# Patient Record
Sex: Male | Born: 1965
Health system: Southern US, Community
[De-identification: ages and names within clinical notes are randomized; demographics above are authoritative.]

## PROBLEM LIST (undated history)

## (undated) DIAGNOSIS — E10319 Type 1 diabetes mellitus with unspecified diabetic retinopathy without macular edema: Secondary | ICD-10-CM

## (undated) DIAGNOSIS — I739 Peripheral vascular disease, unspecified: Secondary | ICD-10-CM

## (undated) DIAGNOSIS — E0590 Thyrotoxicosis, unspecified without thyrotoxic crisis or storm: Secondary | ICD-10-CM

## (undated) DIAGNOSIS — E785 Hyperlipidemia, unspecified: Secondary | ICD-10-CM

## (undated) DIAGNOSIS — E109 Type 1 diabetes mellitus without complications: Secondary | ICD-10-CM

## (undated) DIAGNOSIS — I10 Essential (primary) hypertension: Secondary | ICD-10-CM

## (undated) DIAGNOSIS — N182 Chronic kidney disease, stage 2 (mild): Secondary | ICD-10-CM

## (undated) DIAGNOSIS — E1021 Type 1 diabetes mellitus with diabetic nephropathy: Secondary | ICD-10-CM

## (undated) HISTORY — DX: Hyperlipidemia, unspecified: E78.5

## (undated) HISTORY — DX: Chronic kidney disease, stage 2 (mild): N18.2

## (undated) HISTORY — DX: Type 1 diabetes mellitus with diabetic nephropathy: E10.21

## (undated) HISTORY — DX: Thyrotoxicosis, unspecified without thyrotoxic crisis or storm: E05.90

## (undated) HISTORY — DX: Type 1 diabetes mellitus without complications: E10.9

## (undated) HISTORY — DX: Type 1 diabetes mellitus with unspecified diabetic retinopathy without macular edema: E10.319

## (undated) HISTORY — DX: Essential (primary) hypertension: I10

## (undated) HISTORY — DX: Peripheral vascular disease, unspecified: I73.9

---

## 2010-01-17 ENCOUNTER — Encounter
Admission: RE | Admit: 2010-01-17 | Discharge: 2010-01-17 | Payer: Self-pay | Source: Home / Self Care | Attending: Neurosurgery | Admitting: Neurosurgery

## 2011-01-14 DIAGNOSIS — E109 Type 1 diabetes mellitus without complications: Secondary | ICD-10-CM | POA: Diagnosis not present

## 2011-01-14 DIAGNOSIS — I10 Essential (primary) hypertension: Secondary | ICD-10-CM | POA: Diagnosis not present

## 2011-01-14 DIAGNOSIS — E039 Hypothyroidism, unspecified: Secondary | ICD-10-CM | POA: Diagnosis not present

## 2011-01-14 DIAGNOSIS — E785 Hyperlipidemia, unspecified: Secondary | ICD-10-CM | POA: Diagnosis not present

## 2011-02-19 DIAGNOSIS — E1169 Type 2 diabetes mellitus with other specified complication: Secondary | ICD-10-CM | POA: Diagnosis not present

## 2011-02-19 DIAGNOSIS — E785 Hyperlipidemia, unspecified: Secondary | ICD-10-CM | POA: Diagnosis not present

## 2011-02-19 DIAGNOSIS — E1142 Type 2 diabetes mellitus with diabetic polyneuropathy: Secondary | ICD-10-CM | POA: Diagnosis not present

## 2011-02-19 DIAGNOSIS — E1049 Type 1 diabetes mellitus with other diabetic neurological complication: Secondary | ICD-10-CM | POA: Diagnosis not present

## 2011-02-25 DIAGNOSIS — E1169 Type 2 diabetes mellitus with other specified complication: Secondary | ICD-10-CM | POA: Diagnosis not present

## 2011-02-25 DIAGNOSIS — E1065 Type 1 diabetes mellitus with hyperglycemia: Secondary | ICD-10-CM | POA: Diagnosis not present

## 2011-02-25 DIAGNOSIS — IMO0002 Reserved for concepts with insufficient information to code with codable children: Secondary | ICD-10-CM | POA: Diagnosis not present

## 2011-02-26 DIAGNOSIS — E1039 Type 1 diabetes mellitus with other diabetic ophthalmic complication: Secondary | ICD-10-CM | POA: Diagnosis not present

## 2011-02-26 DIAGNOSIS — E11359 Type 2 diabetes mellitus with proliferative diabetic retinopathy without macular edema: Secondary | ICD-10-CM | POA: Diagnosis not present

## 2011-02-26 DIAGNOSIS — H16219 Exposure keratoconjunctivitis, unspecified eye: Secondary | ICD-10-CM | POA: Diagnosis not present

## 2011-03-06 DIAGNOSIS — IMO0002 Reserved for concepts with insufficient information to code with codable children: Secondary | ICD-10-CM | POA: Diagnosis not present

## 2011-03-06 DIAGNOSIS — E1065 Type 1 diabetes mellitus with hyperglycemia: Secondary | ICD-10-CM | POA: Diagnosis not present

## 2011-04-09 DIAGNOSIS — E109 Type 1 diabetes mellitus without complications: Secondary | ICD-10-CM | POA: Diagnosis not present

## 2011-04-09 DIAGNOSIS — Z713 Dietary counseling and surveillance: Secondary | ICD-10-CM | POA: Diagnosis not present

## 2011-04-09 DIAGNOSIS — Z794 Long term (current) use of insulin: Secondary | ICD-10-CM | POA: Diagnosis not present

## 2011-04-09 DIAGNOSIS — Z9641 Presence of insulin pump (external) (internal): Secondary | ICD-10-CM | POA: Diagnosis not present

## 2011-04-15 DIAGNOSIS — E109 Type 1 diabetes mellitus without complications: Secondary | ICD-10-CM | POA: Diagnosis not present

## 2011-04-15 DIAGNOSIS — I10 Essential (primary) hypertension: Secondary | ICD-10-CM | POA: Diagnosis not present

## 2011-04-15 DIAGNOSIS — E785 Hyperlipidemia, unspecified: Secondary | ICD-10-CM | POA: Diagnosis not present

## 2011-05-09 DIAGNOSIS — I10 Essential (primary) hypertension: Secondary | ICD-10-CM | POA: Diagnosis not present

## 2011-05-20 DIAGNOSIS — E1142 Type 2 diabetes mellitus with diabetic polyneuropathy: Secondary | ICD-10-CM | POA: Diagnosis not present

## 2011-05-20 DIAGNOSIS — E785 Hyperlipidemia, unspecified: Secondary | ICD-10-CM | POA: Diagnosis not present

## 2011-05-20 DIAGNOSIS — I10 Essential (primary) hypertension: Secondary | ICD-10-CM | POA: Diagnosis not present

## 2011-05-20 DIAGNOSIS — E1049 Type 1 diabetes mellitus with other diabetic neurological complication: Secondary | ICD-10-CM | POA: Diagnosis not present

## 2011-05-22 DIAGNOSIS — H538 Other visual disturbances: Secondary | ICD-10-CM | POA: Diagnosis not present
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
## 2011-07-15 DIAGNOSIS — E109 Type 1 diabetes mellitus without complications: Secondary | ICD-10-CM | POA: Diagnosis not present

## 2011-07-15 DIAGNOSIS — I10 Essential (primary) hypertension: Secondary | ICD-10-CM | POA: Diagnosis not present

## 2011-07-15 DIAGNOSIS — E785 Hyperlipidemia, unspecified: Secondary | ICD-10-CM | POA: Diagnosis not present

## 2011-07-24 DIAGNOSIS — Z794 Long term (current) use of insulin: Secondary | ICD-10-CM | POA: Diagnosis not present

## 2011-07-24 DIAGNOSIS — Z713 Dietary counseling and surveillance: Secondary | ICD-10-CM | POA: Diagnosis not present

## 2011-07-24 DIAGNOSIS — E109 Type 1 diabetes mellitus without complications: Secondary | ICD-10-CM | POA: Diagnosis not present

## 2011-07-24 DIAGNOSIS — Z9641 Presence of insulin pump (external) (internal): Secondary | ICD-10-CM | POA: Diagnosis not present

## 2011-08-21 DIAGNOSIS — E1065 Type 1 diabetes mellitus with hyperglycemia: Secondary | ICD-10-CM | POA: Diagnosis not present

## 2011-08-21 DIAGNOSIS — E1049 Type 1 diabetes mellitus with other diabetic neurological complication: Secondary | ICD-10-CM | POA: Diagnosis not present

## 2011-08-21 DIAGNOSIS — E785 Hyperlipidemia, unspecified: Secondary | ICD-10-CM | POA: Diagnosis not present

## 2011-08-21 DIAGNOSIS — E890 Postprocedural hypothyroidism: Secondary | ICD-10-CM | POA: Diagnosis not present

## 2011-08-21 DIAGNOSIS — E1142 Type 2 diabetes mellitus with diabetic polyneuropathy: Secondary | ICD-10-CM | POA: Diagnosis not present

## 2011-08-21 DIAGNOSIS — IMO0002 Reserved for concepts with insufficient information to code with codable children: Secondary | ICD-10-CM | POA: Diagnosis not present

## 2011-08-27 DIAGNOSIS — H4011X0 Primary open-angle glaucoma, stage unspecified: Secondary | ICD-10-CM | POA: Diagnosis not present

## 2011-08-27 DIAGNOSIS — E11359 Type 2 diabetes mellitus with proliferative diabetic retinopathy without macular edema: Secondary | ICD-10-CM | POA: Diagnosis not present

## 2011-08-27 DIAGNOSIS — E1039 Type 1 diabetes mellitus with other diabetic ophthalmic complication: Secondary | ICD-10-CM | POA: Diagnosis not present

## 2011-10-16 DIAGNOSIS — E756 Lipid storage disorder, unspecified: Secondary | ICD-10-CM | POA: Diagnosis not present

## 2011-10-16 DIAGNOSIS — E109 Type 1 diabetes mellitus without complications: Secondary | ICD-10-CM | POA: Diagnosis not present

## 2011-10-16 DIAGNOSIS — E785 Hyperlipidemia, unspecified: Secondary | ICD-10-CM | POA: Diagnosis not present

## 2011-10-30 DIAGNOSIS — E1065 Type 1 diabetes mellitus with hyperglycemia: Secondary | ICD-10-CM | POA: Diagnosis not present

## 2011-10-30 DIAGNOSIS — IMO0002 Reserved for concepts with insufficient information to code with codable children: Secondary | ICD-10-CM | POA: Diagnosis not present

## 2011-11-22 DIAGNOSIS — E1142 Type 2 diabetes mellitus with diabetic polyneuropathy: Secondary | ICD-10-CM | POA: Diagnosis not present

## 2011-11-22 DIAGNOSIS — E785 Hyperlipidemia, unspecified: Secondary | ICD-10-CM | POA: Diagnosis not present

## 2011-11-22 DIAGNOSIS — I10 Essential (primary) hypertension: Secondary | ICD-10-CM | POA: Diagnosis not present

## 2011-11-22 DIAGNOSIS — E890 Postprocedural hypothyroidism: Secondary | ICD-10-CM | POA: Diagnosis not present

## 2011-11-22 DIAGNOSIS — J019 Acute sinusitis, unspecified: Secondary | ICD-10-CM | POA: Diagnosis not present

## 2011-11-22 DIAGNOSIS — E1149 Type 2 diabetes mellitus with other diabetic neurological complication: Secondary | ICD-10-CM | POA: Diagnosis not present

## 2012-01-16 DIAGNOSIS — E785 Hyperlipidemia, unspecified: Secondary | ICD-10-CM | POA: Diagnosis not present

## 2012-01-16 DIAGNOSIS — IMO0002 Reserved for concepts with insufficient information to code with codable children: Secondary | ICD-10-CM | POA: Diagnosis not present

## 2012-01-16 DIAGNOSIS — E109 Type 1 diabetes mellitus without complications: Secondary | ICD-10-CM | POA: Diagnosis not present

## 2012-01-29 DIAGNOSIS — E1065 Type 1 diabetes mellitus with hyperglycemia: Secondary | ICD-10-CM | POA: Diagnosis not present

## 2012-01-29 DIAGNOSIS — IMO0002 Reserved for concepts with insufficient information to code with codable children: Secondary | ICD-10-CM | POA: Diagnosis not present

## 2012-01-30 DIAGNOSIS — M25539 Pain in unspecified wrist: Secondary | ICD-10-CM | POA: Diagnosis not present

## 2012-02-04 DIAGNOSIS — M25539 Pain in unspecified wrist: Secondary | ICD-10-CM | POA: Diagnosis not present

## 2012-02-04 DIAGNOSIS — M19039 Primary osteoarthritis, unspecified wrist: Secondary | ICD-10-CM | POA: Diagnosis not present

## 2012-02-11 DIAGNOSIS — M25539 Pain in unspecified wrist: Secondary | ICD-10-CM | POA: Diagnosis not present

## 2012-02-24 DIAGNOSIS — E1149 Type 2 diabetes mellitus with other diabetic neurological complication: Secondary | ICD-10-CM | POA: Diagnosis not present

## 2012-02-24 DIAGNOSIS — E890 Postprocedural hypothyroidism: Secondary | ICD-10-CM | POA: Diagnosis not present

## 2012-02-24 DIAGNOSIS — E1142 Type 2 diabetes mellitus with diabetic polyneuropathy: Secondary | ICD-10-CM | POA: Diagnosis not present

## 2012-02-24 DIAGNOSIS — E1065 Type 1 diabetes mellitus with hyperglycemia: Secondary | ICD-10-CM | POA: Diagnosis not present

## 2012-02-24 DIAGNOSIS — IMO0002 Reserved for concepts with insufficient information to code with codable children: Secondary | ICD-10-CM | POA: Diagnosis not present

## 2012-03-03 DIAGNOSIS — H4011X0 Primary open-angle glaucoma, stage unspecified: Secondary | ICD-10-CM | POA: Diagnosis not present

## 2012-03-03 DIAGNOSIS — E11359 Type 2 diabetes mellitus with proliferative diabetic retinopathy without macular edema: Secondary | ICD-10-CM | POA: Diagnosis not present

## 2012-03-03 DIAGNOSIS — E1039 Type 1 diabetes mellitus with other diabetic ophthalmic complication: Secondary | ICD-10-CM | POA: Diagnosis not present

## 2012-04-16 DIAGNOSIS — E109 Type 1 diabetes mellitus without complications: Secondary | ICD-10-CM | POA: Diagnosis not present

## 2012-04-29 DIAGNOSIS — IMO0002 Reserved for concepts with insufficient information to code with codable children: Secondary | ICD-10-CM | POA: Diagnosis not present

## 2012-04-29 DIAGNOSIS — E1065 Type 1 diabetes mellitus with hyperglycemia: Secondary | ICD-10-CM | POA: Diagnosis not present

## 2012-05-28 DIAGNOSIS — E890 Postprocedural hypothyroidism: Secondary | ICD-10-CM | POA: Diagnosis not present

## 2012-05-28 DIAGNOSIS — I10 Essential (primary) hypertension: Secondary | ICD-10-CM | POA: Diagnosis not present

## 2012-05-28 DIAGNOSIS — IMO0002 Reserved for concepts with insufficient information to code with codable children: Secondary | ICD-10-CM | POA: Diagnosis not present

## 2012-05-28 DIAGNOSIS — E1065 Type 1 diabetes mellitus with hyperglycemia: Secondary | ICD-10-CM | POA: Diagnosis not present

## 2012-05-28 DIAGNOSIS — E1169 Type 2 diabetes mellitus with other specified complication: Secondary | ICD-10-CM | POA: Diagnosis not present

## 2012-05-28 DIAGNOSIS — E162 Hypoglycemia, unspecified: Secondary | ICD-10-CM | POA: Diagnosis not present

## 2012-05-28 DIAGNOSIS — E785 Hyperlipidemia, unspecified: Secondary | ICD-10-CM | POA: Diagnosis not present

## 2012-06-08 DIAGNOSIS — I10 Essential (primary) hypertension: Secondary | ICD-10-CM | POA: Diagnosis not present

## 2012-07-27 DIAGNOSIS — E109 Type 1 diabetes mellitus without complications: Secondary | ICD-10-CM | POA: Diagnosis not present

## 2012-07-27 DIAGNOSIS — I10 Essential (primary) hypertension: Secondary | ICD-10-CM | POA: Diagnosis not present

## 2012-07-28 DIAGNOSIS — E1065 Type 1 diabetes mellitus with hyperglycemia: Secondary | ICD-10-CM | POA: Diagnosis not present

## 2012-07-28 DIAGNOSIS — IMO0002 Reserved for concepts with insufficient information to code with codable children: Secondary | ICD-10-CM | POA: Diagnosis not present

## 2012-08-03 DIAGNOSIS — E785 Hyperlipidemia, unspecified: Secondary | ICD-10-CM | POA: Diagnosis not present

## 2012-08-03 DIAGNOSIS — E1065 Type 1 diabetes mellitus with hyperglycemia: Secondary | ICD-10-CM | POA: Diagnosis not present

## 2012-08-03 DIAGNOSIS — IMO0002 Reserved for concepts with insufficient information to code with codable children: Secondary | ICD-10-CM | POA: Diagnosis not present

## 2012-08-03 DIAGNOSIS — E1142 Type 2 diabetes mellitus with diabetic polyneuropathy: Secondary | ICD-10-CM | POA: Diagnosis not present

## 2012-08-03 DIAGNOSIS — I10 Essential (primary) hypertension: Secondary | ICD-10-CM | POA: Diagnosis not present

## 2012-08-03 DIAGNOSIS — E1149 Type 2 diabetes mellitus with other diabetic neurological complication: Secondary | ICD-10-CM | POA: Diagnosis not present

## 2012-09-01 DIAGNOSIS — E0500 Thyrotoxicosis with diffuse goiter without thyrotoxic crisis or storm: Secondary | ICD-10-CM | POA: Diagnosis not present

## 2012-09-01 DIAGNOSIS — E11359 Type 2 diabetes mellitus with proliferative diabetic retinopathy without macular edema: Secondary | ICD-10-CM | POA: Diagnosis not present

## 2012-09-01 DIAGNOSIS — H30009 Unspecified focal chorioretinal inflammation, unspecified eye: Secondary | ICD-10-CM | POA: Diagnosis not present

## 2012-09-01 DIAGNOSIS — H353190 Nonexudative age-related macular degeneration, unspecified eye, stage unspecified: Secondary | ICD-10-CM | POA: Diagnosis not present

## 2012-09-01 DIAGNOSIS — H31009 Unspecified chorioretinal scars, unspecified eye: Secondary | ICD-10-CM | POA: Diagnosis not present

## 2012-10-27 DIAGNOSIS — IMO0002 Reserved for concepts with insufficient information to code with codable children: Secondary | ICD-10-CM | POA: Diagnosis not present

## 2012-10-27 DIAGNOSIS — E1065 Type 1 diabetes mellitus with hyperglycemia: Secondary | ICD-10-CM | POA: Diagnosis not present

## 2012-11-10 DIAGNOSIS — E785 Hyperlipidemia, unspecified: Secondary | ICD-10-CM | POA: Diagnosis not present

## 2012-11-10 DIAGNOSIS — IMO0002 Reserved for concepts with insufficient information to code with codable children: Secondary | ICD-10-CM | POA: Diagnosis not present

## 2012-11-10 DIAGNOSIS — E1065 Type 1 diabetes mellitus with hyperglycemia: Secondary | ICD-10-CM | POA: Diagnosis not present

## 2012-11-10 DIAGNOSIS — E890 Postprocedural hypothyroidism: Secondary | ICD-10-CM | POA: Diagnosis not present

## 2012-11-27 DIAGNOSIS — IMO0002 Reserved for concepts with insufficient information to code with codable children: Secondary | ICD-10-CM | POA: Diagnosis not present

## 2012-11-27 DIAGNOSIS — E1065 Type 1 diabetes mellitus with hyperglycemia: Secondary | ICD-10-CM | POA: Diagnosis not present

## 2013-01-26 DIAGNOSIS — IMO0002 Reserved for concepts with insufficient information to code with codable children: Secondary | ICD-10-CM | POA: Diagnosis not present

## 2013-01-26 DIAGNOSIS — E1065 Type 1 diabetes mellitus with hyperglycemia: Secondary | ICD-10-CM | POA: Diagnosis not present

## 2013-03-22 DIAGNOSIS — E1039 Type 1 diabetes mellitus with other diabetic ophthalmic complication: Secondary | ICD-10-CM | POA: Diagnosis not present

## 2013-03-22 DIAGNOSIS — E11359 Type 2 diabetes mellitus with proliferative diabetic retinopathy without macular edema: Secondary | ICD-10-CM | POA: Diagnosis not present

## 2013-04-27 DIAGNOSIS — E1065 Type 1 diabetes mellitus with hyperglycemia: Secondary | ICD-10-CM | POA: Diagnosis not present

## 2013-04-27 DIAGNOSIS — IMO0002 Reserved for concepts with insufficient information to code with codable children: Secondary | ICD-10-CM | POA: Diagnosis not present

## 2013-05-21 DIAGNOSIS — E785 Hyperlipidemia, unspecified: Secondary | ICD-10-CM | POA: Diagnosis not present

## 2013-05-21 DIAGNOSIS — E890 Postprocedural hypothyroidism: Secondary | ICD-10-CM | POA: Diagnosis not present

## 2013-05-21 DIAGNOSIS — IMO0002 Reserved for concepts with insufficient information to code with codable children: Secondary | ICD-10-CM | POA: Diagnosis not present

## 2013-05-21 DIAGNOSIS — E1065 Type 1 diabetes mellitus with hyperglycemia: Secondary | ICD-10-CM | POA: Diagnosis not present

## 2013-05-21 DIAGNOSIS — I10 Essential (primary) hypertension: Secondary | ICD-10-CM | POA: Diagnosis not present

## 2013-06-02 DIAGNOSIS — E109 Type 1 diabetes mellitus without complications: Secondary | ICD-10-CM | POA: Diagnosis not present

## 2013-07-27 DIAGNOSIS — E109 Type 1 diabetes mellitus without complications: Secondary | ICD-10-CM | POA: Diagnosis not present

## 2013-08-11 DIAGNOSIS — E109 Type 1 diabetes mellitus without complications: Secondary | ICD-10-CM | POA: Diagnosis not present

## 2013-08-26 DIAGNOSIS — IMO0002 Reserved for concepts with insufficient information to code with codable children: Secondary | ICD-10-CM | POA: Diagnosis not present

## 2013-08-26 DIAGNOSIS — I10 Essential (primary) hypertension: Secondary | ICD-10-CM | POA: Diagnosis not present

## 2013-08-26 DIAGNOSIS — E785 Hyperlipidemia, unspecified: Secondary | ICD-10-CM | POA: Diagnosis not present

## 2013-08-26 DIAGNOSIS — E890 Postprocedural hypothyroidism: Secondary | ICD-10-CM | POA: Diagnosis not present

## 2013-08-26 DIAGNOSIS — E1065 Type 1 diabetes mellitus with hyperglycemia: Secondary | ICD-10-CM | POA: Diagnosis not present

## 2013-09-03 DIAGNOSIS — E109 Type 1 diabetes mellitus without complications: Secondary | ICD-10-CM | POA: Diagnosis not present

## 2013-09-03 DIAGNOSIS — E785 Hyperlipidemia, unspecified: Secondary | ICD-10-CM | POA: Diagnosis not present

## 2013-09-03 DIAGNOSIS — I10 Essential (primary) hypertension: Secondary | ICD-10-CM | POA: Diagnosis not present

## 2013-09-29 DIAGNOSIS — E109 Type 1 diabetes mellitus without complications: Secondary | ICD-10-CM | POA: Diagnosis not present

## 2013-09-29 DIAGNOSIS — E785 Hyperlipidemia, unspecified: Secondary | ICD-10-CM | POA: Diagnosis not present

## 2013-09-29 DIAGNOSIS — E11319 Type 2 diabetes mellitus with unspecified diabetic retinopathy without macular edema: Secondary | ICD-10-CM | POA: Diagnosis not present

## 2013-09-29 DIAGNOSIS — Z1331 Encounter for screening for depression: Secondary | ICD-10-CM | POA: Diagnosis not present

## 2013-09-29 DIAGNOSIS — I10 Essential (primary) hypertension: Secondary | ICD-10-CM | POA: Diagnosis not present

## 2013-09-29 DIAGNOSIS — E0590 Thyrotoxicosis, unspecified without thyrotoxic crisis or storm: Secondary | ICD-10-CM | POA: Diagnosis not present

## 2013-10-12 DIAGNOSIS — E1065 Type 1 diabetes mellitus with hyperglycemia: Secondary | ICD-10-CM | POA: Diagnosis not present

## 2013-11-22 DIAGNOSIS — H31009 Unspecified chorioretinal scars, unspecified eye: Secondary | ICD-10-CM | POA: Diagnosis not present

## 2013-11-22 DIAGNOSIS — E1039 Type 1 diabetes mellitus with other diabetic ophthalmic complication: Secondary | ICD-10-CM | POA: Diagnosis not present

## 2013-11-22 DIAGNOSIS — H4011X3 Primary open-angle glaucoma, severe stage: Secondary | ICD-10-CM | POA: Diagnosis not present

## 2013-12-06 DIAGNOSIS — E1029 Type 1 diabetes mellitus with other diabetic kidney complication: Secondary | ICD-10-CM | POA: Diagnosis not present

## 2013-12-06 DIAGNOSIS — E1040 Type 1 diabetes mellitus with diabetic neuropathy, unspecified: Secondary | ICD-10-CM | POA: Diagnosis not present

## 2013-12-06 DIAGNOSIS — J209 Acute bronchitis, unspecified: Secondary | ICD-10-CM | POA: Diagnosis not present

## 2013-12-06 DIAGNOSIS — I10 Essential (primary) hypertension: Secondary | ICD-10-CM | POA: Diagnosis not present

## 2013-12-22 DIAGNOSIS — E890 Postprocedural hypothyroidism: Secondary | ICD-10-CM | POA: Diagnosis not present

## 2013-12-22 DIAGNOSIS — E785 Hyperlipidemia, unspecified: Secondary | ICD-10-CM | POA: Diagnosis not present

## 2013-12-22 DIAGNOSIS — E1065 Type 1 diabetes mellitus with hyperglycemia: Secondary | ICD-10-CM | POA: Diagnosis not present

## 2014-02-09 DIAGNOSIS — E1065 Type 1 diabetes mellitus with hyperglycemia: Secondary | ICD-10-CM | POA: Diagnosis not present

## 2014-03-07 DIAGNOSIS — J302 Other seasonal allergic rhinitis: Secondary | ICD-10-CM | POA: Diagnosis not present

## 2014-03-07 DIAGNOSIS — E1140 Type 2 diabetes mellitus with diabetic neuropathy, unspecified: Secondary | ICD-10-CM | POA: Diagnosis not present

## 2014-03-07 DIAGNOSIS — E1121 Type 2 diabetes mellitus with diabetic nephropathy: Secondary | ICD-10-CM | POA: Diagnosis not present

## 2014-03-07 DIAGNOSIS — E785 Hyperlipidemia, unspecified: Secondary | ICD-10-CM | POA: Diagnosis not present

## 2014-03-07 DIAGNOSIS — E1165 Type 2 diabetes mellitus with hyperglycemia: Secondary | ICD-10-CM | POA: Diagnosis not present

## 2014-04-13 DIAGNOSIS — E785 Hyperlipidemia, unspecified: Secondary | ICD-10-CM | POA: Diagnosis not present

## 2014-04-13 DIAGNOSIS — E1040 Type 1 diabetes mellitus with diabetic neuropathy, unspecified: Secondary | ICD-10-CM | POA: Diagnosis not present

## 2014-04-13 DIAGNOSIS — E890 Postprocedural hypothyroidism: Secondary | ICD-10-CM | POA: Diagnosis not present

## 2014-04-19 DIAGNOSIS — S90211A Contusion of right great toe with damage to nail, initial encounter: Secondary | ICD-10-CM | POA: Diagnosis not present

## 2014-04-19 DIAGNOSIS — E1040 Type 1 diabetes mellitus with diabetic neuropathy, unspecified: Secondary | ICD-10-CM | POA: Diagnosis not present

## 2014-05-23 DIAGNOSIS — E11359 Type 2 diabetes mellitus with proliferative diabetic retinopathy without macular edema: Secondary | ICD-10-CM | POA: Diagnosis not present

## 2014-05-23 DIAGNOSIS — E1039 Type 1 diabetes mellitus with other diabetic ophthalmic complication: Secondary | ICD-10-CM | POA: Diagnosis not present

## 2014-05-23 DIAGNOSIS — H35372 Puckering of macula, left eye: Secondary | ICD-10-CM | POA: Diagnosis not present

## 2014-06-02 DIAGNOSIS — E1042 Type 1 diabetes mellitus with diabetic polyneuropathy: Secondary | ICD-10-CM | POA: Diagnosis not present

## 2014-06-10 DIAGNOSIS — E1029 Type 1 diabetes mellitus with other diabetic kidney complication: Secondary | ICD-10-CM | POA: Diagnosis not present

## 2014-06-10 DIAGNOSIS — E785 Hyperlipidemia, unspecified: Secondary | ICD-10-CM | POA: Diagnosis not present

## 2014-06-10 DIAGNOSIS — E1140 Type 2 diabetes mellitus with diabetic neuropathy, unspecified: Secondary | ICD-10-CM | POA: Diagnosis not present

## 2014-06-10 DIAGNOSIS — J302 Other seasonal allergic rhinitis: Secondary | ICD-10-CM | POA: Diagnosis not present

## 2014-06-10 DIAGNOSIS — R809 Proteinuria, unspecified: Secondary | ICD-10-CM | POA: Diagnosis not present

## 2014-06-23 DIAGNOSIS — J302 Other seasonal allergic rhinitis: Secondary | ICD-10-CM | POA: Diagnosis not present

## 2014-07-15 DIAGNOSIS — E890 Postprocedural hypothyroidism: Secondary | ICD-10-CM | POA: Diagnosis not present

## 2014-07-15 DIAGNOSIS — I10 Essential (primary) hypertension: Secondary | ICD-10-CM | POA: Diagnosis not present

## 2014-07-15 DIAGNOSIS — E1065 Type 1 diabetes mellitus with hyperglycemia: Secondary | ICD-10-CM | POA: Diagnosis not present

## 2014-09-06 DIAGNOSIS — E1065 Type 1 diabetes mellitus with hyperglycemia: Secondary | ICD-10-CM | POA: Diagnosis not present

## 2014-09-20 DIAGNOSIS — E1029 Type 1 diabetes mellitus with other diabetic kidney complication: Secondary | ICD-10-CM | POA: Diagnosis not present

## 2014-09-20 DIAGNOSIS — E1042 Type 1 diabetes mellitus with diabetic polyneuropathy: Secondary | ICD-10-CM | POA: Diagnosis not present

## 2014-09-20 DIAGNOSIS — E1069 Type 1 diabetes mellitus with other specified complication: Secondary | ICD-10-CM | POA: Diagnosis not present

## 2014-09-20 DIAGNOSIS — N182 Chronic kidney disease, stage 2 (mild): Secondary | ICD-10-CM | POA: Diagnosis not present

## 2014-09-20 DIAGNOSIS — E1039 Type 1 diabetes mellitus with other diabetic ophthalmic complication: Secondary | ICD-10-CM | POA: Diagnosis not present

## 2014-10-21 DIAGNOSIS — E785 Hyperlipidemia, unspecified: Secondary | ICD-10-CM | POA: Diagnosis not present

## 2014-10-21 DIAGNOSIS — E1065 Type 1 diabetes mellitus with hyperglycemia: Secondary | ICD-10-CM | POA: Diagnosis not present

## 2014-10-21 DIAGNOSIS — E890 Postprocedural hypothyroidism: Secondary | ICD-10-CM | POA: Diagnosis not present

## 2014-11-24 DIAGNOSIS — E1039 Type 1 diabetes mellitus with other diabetic ophthalmic complication: Secondary | ICD-10-CM | POA: Diagnosis not present

## 2014-11-24 DIAGNOSIS — H35372 Puckering of macula, left eye: Secondary | ICD-10-CM | POA: Diagnosis not present

## 2014-11-24 DIAGNOSIS — E103593 Type 1 diabetes mellitus with proliferative diabetic retinopathy without macular edema, bilateral: Secondary | ICD-10-CM | POA: Diagnosis not present

## 2014-11-24 DIAGNOSIS — H401133 Primary open-angle glaucoma, bilateral, severe stage: Secondary | ICD-10-CM | POA: Diagnosis not present

## 2014-12-27 DIAGNOSIS — E1029 Type 1 diabetes mellitus with other diabetic kidney complication: Secondary | ICD-10-CM | POA: Diagnosis not present

## 2014-12-27 DIAGNOSIS — I10 Essential (primary) hypertension: Secondary | ICD-10-CM | POA: Diagnosis not present

## 2014-12-27 DIAGNOSIS — R3911 Hesitancy of micturition: Secondary | ICD-10-CM | POA: Diagnosis not present

## 2014-12-27 DIAGNOSIS — R809 Proteinuria, unspecified: Secondary | ICD-10-CM | POA: Diagnosis not present

## 2014-12-27 DIAGNOSIS — E10319 Type 1 diabetes mellitus with unspecified diabetic retinopathy without macular edema: Secondary | ICD-10-CM | POA: Diagnosis not present

## 2014-12-27 DIAGNOSIS — E785 Hyperlipidemia, unspecified: Secondary | ICD-10-CM | POA: Diagnosis not present

## 2014-12-27 DIAGNOSIS — E039 Hypothyroidism, unspecified: Secondary | ICD-10-CM | POA: Diagnosis not present

## 2014-12-27 DIAGNOSIS — E1040 Type 1 diabetes mellitus with diabetic neuropathy, unspecified: Secondary | ICD-10-CM | POA: Diagnosis not present

## 2014-12-29 DIAGNOSIS — Z9119 Patient's noncompliance with other medical treatment and regimen: Secondary | ICD-10-CM | POA: Diagnosis not present

## 2014-12-29 DIAGNOSIS — E1042 Type 1 diabetes mellitus with diabetic polyneuropathy: Secondary | ICD-10-CM | POA: Diagnosis not present

## 2014-12-29 DIAGNOSIS — E1065 Type 1 diabetes mellitus with hyperglycemia: Secondary | ICD-10-CM | POA: Diagnosis not present

## 2015-01-11 DIAGNOSIS — E785 Hyperlipidemia, unspecified: Secondary | ICD-10-CM | POA: Diagnosis not present

## 2015-01-24 DIAGNOSIS — E103493 Type 1 diabetes mellitus with severe nonproliferative diabetic retinopathy without macular edema, bilateral: Secondary | ICD-10-CM | POA: Diagnosis not present

## 2015-02-09 DIAGNOSIS — E162 Hypoglycemia, unspecified: Secondary | ICD-10-CM | POA: Diagnosis not present

## 2015-02-09 DIAGNOSIS — E890 Postprocedural hypothyroidism: Secondary | ICD-10-CM | POA: Diagnosis not present

## 2015-02-09 DIAGNOSIS — E1065 Type 1 diabetes mellitus with hyperglycemia: Secondary | ICD-10-CM | POA: Diagnosis not present

## 2015-04-04 DIAGNOSIS — E1042 Type 1 diabetes mellitus with diabetic polyneuropathy: Secondary | ICD-10-CM | POA: Diagnosis not present

## 2015-04-04 DIAGNOSIS — E10641 Type 1 diabetes mellitus with hypoglycemia with coma: Secondary | ICD-10-CM | POA: Diagnosis not present

## 2015-04-04 DIAGNOSIS — E1065 Type 1 diabetes mellitus with hyperglycemia: Secondary | ICD-10-CM | POA: Diagnosis not present

## 2015-04-05 DIAGNOSIS — E11319 Type 2 diabetes mellitus with unspecified diabetic retinopathy without macular edema: Secondary | ICD-10-CM | POA: Diagnosis not present

## 2015-04-05 DIAGNOSIS — H6122 Impacted cerumen, left ear: Secondary | ICD-10-CM | POA: Diagnosis not present

## 2015-05-11 DIAGNOSIS — E890 Postprocedural hypothyroidism: Secondary | ICD-10-CM | POA: Insufficient documentation

## 2015-05-11 DIAGNOSIS — I10 Essential (primary) hypertension: Secondary | ICD-10-CM | POA: Insufficient documentation

## 2015-05-11 DIAGNOSIS — E109 Type 1 diabetes mellitus without complications: Secondary | ICD-10-CM | POA: Insufficient documentation

## 2015-05-15 DIAGNOSIS — E1065 Type 1 diabetes mellitus with hyperglycemia: Secondary | ICD-10-CM | POA: Diagnosis not present

## 2015-05-15 DIAGNOSIS — E890 Postprocedural hypothyroidism: Secondary | ICD-10-CM | POA: Diagnosis not present

## 2015-05-15 DIAGNOSIS — I10 Essential (primary) hypertension: Secondary | ICD-10-CM | POA: Diagnosis not present

## 2015-05-15 DIAGNOSIS — E1042 Type 1 diabetes mellitus with diabetic polyneuropathy: Secondary | ICD-10-CM | POA: Diagnosis not present

## 2015-07-14 DIAGNOSIS — E10621 Type 1 diabetes mellitus with foot ulcer: Secondary | ICD-10-CM | POA: Diagnosis not present

## 2015-07-14 DIAGNOSIS — E1065 Type 1 diabetes mellitus with hyperglycemia: Secondary | ICD-10-CM | POA: Diagnosis not present

## 2015-07-14 DIAGNOSIS — L97509 Non-pressure chronic ulcer of other part of unspecified foot with unspecified severity: Secondary | ICD-10-CM | POA: Diagnosis not present

## 2015-07-14 DIAGNOSIS — E1140 Type 2 diabetes mellitus with diabetic neuropathy, unspecified: Secondary | ICD-10-CM | POA: Diagnosis not present

## 2015-07-14 DIAGNOSIS — E10319 Type 1 diabetes mellitus with unspecified diabetic retinopathy without macular edema: Secondary | ICD-10-CM | POA: Diagnosis not present

## 2015-08-09 DIAGNOSIS — L97511 Non-pressure chronic ulcer of other part of right foot limited to breakdown of skin: Secondary | ICD-10-CM | POA: Diagnosis not present

## 2015-08-09 DIAGNOSIS — I10 Essential (primary) hypertension: Secondary | ICD-10-CM | POA: Diagnosis not present

## 2015-08-09 DIAGNOSIS — I739 Peripheral vascular disease, unspecified: Secondary | ICD-10-CM | POA: Diagnosis not present

## 2015-08-09 DIAGNOSIS — E1042 Type 1 diabetes mellitus with diabetic polyneuropathy: Secondary | ICD-10-CM | POA: Diagnosis not present

## 2015-08-09 DIAGNOSIS — Z794 Long term (current) use of insulin: Secondary | ICD-10-CM | POA: Diagnosis not present

## 2015-08-09 DIAGNOSIS — E10621 Type 1 diabetes mellitus with foot ulcer: Secondary | ICD-10-CM | POA: Diagnosis not present

## 2015-08-09 DIAGNOSIS — L97521 Non-pressure chronic ulcer of other part of left foot limited to breakdown of skin: Secondary | ICD-10-CM | POA: Diagnosis not present

## 2015-08-09 DIAGNOSIS — E1040 Type 1 diabetes mellitus with diabetic neuropathy, unspecified: Secondary | ICD-10-CM | POA: Diagnosis not present

## 2015-08-15 DIAGNOSIS — E10621 Type 1 diabetes mellitus with foot ulcer: Secondary | ICD-10-CM | POA: Diagnosis not present

## 2015-08-15 DIAGNOSIS — I10 Essential (primary) hypertension: Secondary | ICD-10-CM | POA: Diagnosis not present

## 2015-08-15 DIAGNOSIS — I739 Peripheral vascular disease, unspecified: Secondary | ICD-10-CM | POA: Diagnosis not present

## 2015-08-15 DIAGNOSIS — L97511 Non-pressure chronic ulcer of other part of right foot limited to breakdown of skin: Secondary | ICD-10-CM | POA: Diagnosis not present

## 2015-08-15 DIAGNOSIS — E1040 Type 1 diabetes mellitus with diabetic neuropathy, unspecified: Secondary | ICD-10-CM | POA: Diagnosis not present

## 2015-08-22 DIAGNOSIS — L97511 Non-pressure chronic ulcer of other part of right foot limited to breakdown of skin: Secondary | ICD-10-CM | POA: Diagnosis not present

## 2015-08-22 DIAGNOSIS — I739 Peripheral vascular disease, unspecified: Secondary | ICD-10-CM | POA: Diagnosis not present

## 2015-08-22 DIAGNOSIS — E10621 Type 1 diabetes mellitus with foot ulcer: Secondary | ICD-10-CM | POA: Diagnosis not present

## 2015-08-23 DIAGNOSIS — E11621 Type 2 diabetes mellitus with foot ulcer: Secondary | ICD-10-CM | POA: Diagnosis not present

## 2015-08-23 DIAGNOSIS — S99921A Unspecified injury of right foot, initial encounter: Secondary | ICD-10-CM | POA: Diagnosis not present

## 2015-08-23 DIAGNOSIS — L97419 Non-pressure chronic ulcer of right heel and midfoot with unspecified severity: Secondary | ICD-10-CM | POA: Diagnosis not present

## 2015-08-28 DIAGNOSIS — R937 Abnormal findings on diagnostic imaging of other parts of musculoskeletal system: Secondary | ICD-10-CM | POA: Diagnosis not present

## 2015-08-28 DIAGNOSIS — L97519 Non-pressure chronic ulcer of other part of right foot with unspecified severity: Secondary | ICD-10-CM | POA: Diagnosis not present

## 2015-08-28 DIAGNOSIS — E11621 Type 2 diabetes mellitus with foot ulcer: Secondary | ICD-10-CM | POA: Diagnosis not present

## 2015-08-29 DIAGNOSIS — L97511 Non-pressure chronic ulcer of other part of right foot limited to breakdown of skin: Secondary | ICD-10-CM | POA: Diagnosis not present

## 2015-08-29 DIAGNOSIS — I739 Peripheral vascular disease, unspecified: Secondary | ICD-10-CM | POA: Diagnosis not present

## 2015-08-29 DIAGNOSIS — E11621 Type 2 diabetes mellitus with foot ulcer: Secondary | ICD-10-CM | POA: Diagnosis not present

## 2015-08-29 DIAGNOSIS — E10621 Type 1 diabetes mellitus with foot ulcer: Secondary | ICD-10-CM | POA: Diagnosis not present

## 2015-09-04 DIAGNOSIS — I700 Atherosclerosis of aorta: Secondary | ICD-10-CM | POA: Diagnosis not present

## 2015-09-04 DIAGNOSIS — I739 Peripheral vascular disease, unspecified: Secondary | ICD-10-CM | POA: Diagnosis not present

## 2015-09-05 DIAGNOSIS — E10621 Type 1 diabetes mellitus with foot ulcer: Secondary | ICD-10-CM | POA: Diagnosis not present

## 2015-09-05 DIAGNOSIS — I739 Peripheral vascular disease, unspecified: Secondary | ICD-10-CM | POA: Diagnosis not present

## 2015-09-05 DIAGNOSIS — L97511 Non-pressure chronic ulcer of other part of right foot limited to breakdown of skin: Secondary | ICD-10-CM | POA: Diagnosis not present

## 2015-09-06 DIAGNOSIS — I739 Peripheral vascular disease, unspecified: Secondary | ICD-10-CM | POA: Diagnosis not present

## 2015-09-12 DIAGNOSIS — E10621 Type 1 diabetes mellitus with foot ulcer: Secondary | ICD-10-CM | POA: Diagnosis not present

## 2015-09-12 DIAGNOSIS — L97511 Non-pressure chronic ulcer of other part of right foot limited to breakdown of skin: Secondary | ICD-10-CM | POA: Diagnosis not present

## 2015-09-12 DIAGNOSIS — I739 Peripheral vascular disease, unspecified: Secondary | ICD-10-CM | POA: Diagnosis not present

## 2015-09-12 DIAGNOSIS — L97512 Non-pressure chronic ulcer of other part of right foot with fat layer exposed: Secondary | ICD-10-CM | POA: Diagnosis not present

## 2015-09-19 DIAGNOSIS — I739 Peripheral vascular disease, unspecified: Secondary | ICD-10-CM | POA: Diagnosis not present

## 2015-09-19 DIAGNOSIS — L97512 Non-pressure chronic ulcer of other part of right foot with fat layer exposed: Secondary | ICD-10-CM | POA: Diagnosis not present

## 2015-09-19 DIAGNOSIS — E10621 Type 1 diabetes mellitus with foot ulcer: Secondary | ICD-10-CM | POA: Diagnosis not present

## 2015-09-20 DIAGNOSIS — E1065 Type 1 diabetes mellitus with hyperglycemia: Secondary | ICD-10-CM | POA: Diagnosis not present

## 2015-09-20 DIAGNOSIS — I10 Essential (primary) hypertension: Secondary | ICD-10-CM | POA: Diagnosis not present

## 2015-09-20 DIAGNOSIS — E1042 Type 1 diabetes mellitus with diabetic polyneuropathy: Secondary | ICD-10-CM | POA: Diagnosis not present

## 2015-09-20 DIAGNOSIS — E10649 Type 1 diabetes mellitus with hypoglycemia without coma: Secondary | ICD-10-CM | POA: Diagnosis not present

## 2015-09-20 DIAGNOSIS — E890 Postprocedural hypothyroidism: Secondary | ICD-10-CM | POA: Diagnosis not present

## 2015-09-26 DIAGNOSIS — R05 Cough: Secondary | ICD-10-CM | POA: Diagnosis not present

## 2015-09-26 DIAGNOSIS — R079 Chest pain, unspecified: Secondary | ICD-10-CM | POA: Diagnosis not present

## 2015-09-26 DIAGNOSIS — E10621 Type 1 diabetes mellitus with foot ulcer: Secondary | ICD-10-CM | POA: Diagnosis not present

## 2015-09-26 DIAGNOSIS — I10 Essential (primary) hypertension: Secondary | ICD-10-CM | POA: Diagnosis not present

## 2015-09-26 DIAGNOSIS — I739 Peripheral vascular disease, unspecified: Secondary | ICD-10-CM | POA: Diagnosis not present

## 2015-09-26 DIAGNOSIS — L97511 Non-pressure chronic ulcer of other part of right foot limited to breakdown of skin: Secondary | ICD-10-CM | POA: Diagnosis not present

## 2015-10-02 DIAGNOSIS — I739 Peripheral vascular disease, unspecified: Secondary | ICD-10-CM | POA: Diagnosis not present

## 2015-10-02 DIAGNOSIS — E10621 Type 1 diabetes mellitus with foot ulcer: Secondary | ICD-10-CM | POA: Diagnosis not present

## 2015-10-02 DIAGNOSIS — L97512 Non-pressure chronic ulcer of other part of right foot with fat layer exposed: Secondary | ICD-10-CM | POA: Diagnosis not present

## 2015-10-03 DIAGNOSIS — I739 Peripheral vascular disease, unspecified: Secondary | ICD-10-CM | POA: Diagnosis not present

## 2015-10-03 DIAGNOSIS — I771 Stricture of artery: Secondary | ICD-10-CM | POA: Diagnosis not present

## 2015-10-03 DIAGNOSIS — E10621 Type 1 diabetes mellitus with foot ulcer: Secondary | ICD-10-CM | POA: Diagnosis not present

## 2015-10-03 DIAGNOSIS — L97519 Non-pressure chronic ulcer of other part of right foot with unspecified severity: Secondary | ICD-10-CM | POA: Diagnosis not present

## 2015-10-10 DIAGNOSIS — R9431 Abnormal electrocardiogram [ECG] [EKG]: Secondary | ICD-10-CM | POA: Diagnosis not present

## 2015-10-10 DIAGNOSIS — I739 Peripheral vascular disease, unspecified: Secondary | ICD-10-CM | POA: Diagnosis not present

## 2015-10-10 DIAGNOSIS — L97512 Non-pressure chronic ulcer of other part of right foot with fat layer exposed: Secondary | ICD-10-CM | POA: Diagnosis not present

## 2015-10-10 DIAGNOSIS — S81802A Unspecified open wound, left lower leg, initial encounter: Secondary | ICD-10-CM | POA: Diagnosis not present

## 2015-10-10 DIAGNOSIS — E10621 Type 1 diabetes mellitus with foot ulcer: Secondary | ICD-10-CM | POA: Diagnosis not present

## 2015-10-13 DIAGNOSIS — E10621 Type 1 diabetes mellitus with foot ulcer: Secondary | ICD-10-CM | POA: Diagnosis not present

## 2015-10-17 DIAGNOSIS — L97509 Non-pressure chronic ulcer of other part of unspecified foot with unspecified severity: Secondary | ICD-10-CM | POA: Diagnosis not present

## 2015-10-17 DIAGNOSIS — I739 Peripheral vascular disease, unspecified: Secondary | ICD-10-CM | POA: Diagnosis not present

## 2015-10-17 DIAGNOSIS — H6121 Impacted cerumen, right ear: Secondary | ICD-10-CM | POA: Diagnosis not present

## 2015-10-17 DIAGNOSIS — L97511 Non-pressure chronic ulcer of other part of right foot limited to breakdown of skin: Secondary | ICD-10-CM | POA: Diagnosis not present

## 2015-10-17 DIAGNOSIS — L97512 Non-pressure chronic ulcer of other part of right foot with fat layer exposed: Secondary | ICD-10-CM | POA: Diagnosis not present

## 2015-10-17 DIAGNOSIS — E108 Type 1 diabetes mellitus with unspecified complications: Secondary | ICD-10-CM | POA: Diagnosis not present

## 2015-10-17 DIAGNOSIS — E11621 Type 2 diabetes mellitus with foot ulcer: Secondary | ICD-10-CM | POA: Diagnosis not present

## 2015-10-17 DIAGNOSIS — E10621 Type 1 diabetes mellitus with foot ulcer: Secondary | ICD-10-CM | POA: Diagnosis not present

## 2015-10-18 DIAGNOSIS — E1065 Type 1 diabetes mellitus with hyperglycemia: Secondary | ICD-10-CM | POA: Diagnosis not present

## 2015-10-18 DIAGNOSIS — E1042 Type 1 diabetes mellitus with diabetic polyneuropathy: Secondary | ICD-10-CM | POA: Diagnosis not present

## 2015-10-18 DIAGNOSIS — E10641 Type 1 diabetes mellitus with hypoglycemia with coma: Secondary | ICD-10-CM | POA: Diagnosis not present

## 2015-10-24 DIAGNOSIS — T8131XA Disruption of external operation (surgical) wound, not elsewhere classified, initial encounter: Secondary | ICD-10-CM | POA: Diagnosis not present

## 2015-10-24 DIAGNOSIS — I739 Peripheral vascular disease, unspecified: Secondary | ICD-10-CM | POA: Diagnosis not present

## 2015-10-24 DIAGNOSIS — E10621 Type 1 diabetes mellitus with foot ulcer: Secondary | ICD-10-CM | POA: Diagnosis not present

## 2015-10-24 DIAGNOSIS — S71102A Unspecified open wound, left thigh, initial encounter: Secondary | ICD-10-CM | POA: Diagnosis not present

## 2015-10-24 DIAGNOSIS — L97512 Non-pressure chronic ulcer of other part of right foot with fat layer exposed: Secondary | ICD-10-CM | POA: Diagnosis not present

## 2015-10-25 DIAGNOSIS — E11621 Type 2 diabetes mellitus with foot ulcer: Secondary | ICD-10-CM | POA: Diagnosis not present

## 2015-10-25 DIAGNOSIS — E10621 Type 1 diabetes mellitus with foot ulcer: Secondary | ICD-10-CM | POA: Diagnosis not present

## 2015-10-25 DIAGNOSIS — L97511 Non-pressure chronic ulcer of other part of right foot limited to breakdown of skin: Secondary | ICD-10-CM | POA: Diagnosis not present

## 2015-10-26 DIAGNOSIS — L97511 Non-pressure chronic ulcer of other part of right foot limited to breakdown of skin: Secondary | ICD-10-CM | POA: Diagnosis not present

## 2015-10-26 DIAGNOSIS — L97512 Non-pressure chronic ulcer of other part of right foot with fat layer exposed: Secondary | ICD-10-CM | POA: Diagnosis not present

## 2015-10-26 DIAGNOSIS — E11621 Type 2 diabetes mellitus with foot ulcer: Secondary | ICD-10-CM | POA: Diagnosis not present

## 2015-10-26 DIAGNOSIS — E10621 Type 1 diabetes mellitus with foot ulcer: Secondary | ICD-10-CM | POA: Diagnosis not present

## 2015-10-27 DIAGNOSIS — S81802A Unspecified open wound, left lower leg, initial encounter: Secondary | ICD-10-CM | POA: Diagnosis not present

## 2015-10-27 DIAGNOSIS — E11621 Type 2 diabetes mellitus with foot ulcer: Secondary | ICD-10-CM | POA: Diagnosis not present

## 2015-10-27 DIAGNOSIS — L97511 Non-pressure chronic ulcer of other part of right foot limited to breakdown of skin: Secondary | ICD-10-CM | POA: Diagnosis not present

## 2015-10-27 DIAGNOSIS — E10621 Type 1 diabetes mellitus with foot ulcer: Secondary | ICD-10-CM | POA: Diagnosis not present

## 2015-10-27 DIAGNOSIS — I739 Peripheral vascular disease, unspecified: Secondary | ICD-10-CM | POA: Diagnosis not present

## 2015-10-30 DIAGNOSIS — Z9641 Presence of insulin pump (external) (internal): Secondary | ICD-10-CM | POA: Diagnosis not present

## 2015-10-30 DIAGNOSIS — Z794 Long term (current) use of insulin: Secondary | ICD-10-CM | POA: Diagnosis not present

## 2015-10-30 DIAGNOSIS — E11621 Type 2 diabetes mellitus with foot ulcer: Secondary | ICD-10-CM | POA: Diagnosis not present

## 2015-10-30 DIAGNOSIS — J3489 Other specified disorders of nose and nasal sinuses: Secondary | ICD-10-CM | POA: Diagnosis not present

## 2015-10-30 DIAGNOSIS — L97511 Non-pressure chronic ulcer of other part of right foot limited to breakdown of skin: Secondary | ICD-10-CM | POA: Diagnosis not present

## 2015-11-01 DIAGNOSIS — L97511 Non-pressure chronic ulcer of other part of right foot limited to breakdown of skin: Secondary | ICD-10-CM | POA: Diagnosis not present

## 2015-11-01 DIAGNOSIS — E11621 Type 2 diabetes mellitus with foot ulcer: Secondary | ICD-10-CM | POA: Diagnosis not present

## 2015-11-02 DIAGNOSIS — E11621 Type 2 diabetes mellitus with foot ulcer: Secondary | ICD-10-CM | POA: Diagnosis not present

## 2015-11-02 DIAGNOSIS — L97511 Non-pressure chronic ulcer of other part of right foot limited to breakdown of skin: Secondary | ICD-10-CM | POA: Diagnosis not present

## 2015-11-02 DIAGNOSIS — E10621 Type 1 diabetes mellitus with foot ulcer: Secondary | ICD-10-CM | POA: Diagnosis not present

## 2015-11-02 DIAGNOSIS — I739 Peripheral vascular disease, unspecified: Secondary | ICD-10-CM | POA: Diagnosis not present

## 2015-11-03 DIAGNOSIS — E10621 Type 1 diabetes mellitus with foot ulcer: Secondary | ICD-10-CM | POA: Diagnosis not present

## 2015-11-03 DIAGNOSIS — L97512 Non-pressure chronic ulcer of other part of right foot with fat layer exposed: Secondary | ICD-10-CM | POA: Diagnosis not present

## 2015-11-03 DIAGNOSIS — E11621 Type 2 diabetes mellitus with foot ulcer: Secondary | ICD-10-CM | POA: Diagnosis not present

## 2015-11-03 DIAGNOSIS — L97511 Non-pressure chronic ulcer of other part of right foot limited to breakdown of skin: Secondary | ICD-10-CM | POA: Diagnosis not present

## 2015-11-06 DIAGNOSIS — L97512 Non-pressure chronic ulcer of other part of right foot with fat layer exposed: Secondary | ICD-10-CM | POA: Diagnosis not present

## 2015-11-06 DIAGNOSIS — L97519 Non-pressure chronic ulcer of other part of right foot with unspecified severity: Secondary | ICD-10-CM | POA: Diagnosis not present

## 2015-11-06 DIAGNOSIS — E10621 Type 1 diabetes mellitus with foot ulcer: Secondary | ICD-10-CM | POA: Diagnosis not present

## 2015-11-06 DIAGNOSIS — I739 Peripheral vascular disease, unspecified: Secondary | ICD-10-CM | POA: Diagnosis not present

## 2015-11-06 DIAGNOSIS — L97511 Non-pressure chronic ulcer of other part of right foot limited to breakdown of skin: Secondary | ICD-10-CM | POA: Diagnosis not present

## 2015-11-06 DIAGNOSIS — I771 Stricture of artery: Secondary | ICD-10-CM | POA: Diagnosis not present

## 2015-11-06 DIAGNOSIS — S81802A Unspecified open wound, left lower leg, initial encounter: Secondary | ICD-10-CM | POA: Diagnosis not present

## 2015-11-07 DIAGNOSIS — E10621 Type 1 diabetes mellitus with foot ulcer: Secondary | ICD-10-CM | POA: Diagnosis not present

## 2015-11-07 DIAGNOSIS — L97512 Non-pressure chronic ulcer of other part of right foot with fat layer exposed: Secondary | ICD-10-CM | POA: Diagnosis not present

## 2015-11-07 DIAGNOSIS — I739 Peripheral vascular disease, unspecified: Secondary | ICD-10-CM | POA: Diagnosis not present

## 2015-11-08 DIAGNOSIS — L97511 Non-pressure chronic ulcer of other part of right foot limited to breakdown of skin: Secondary | ICD-10-CM | POA: Diagnosis not present

## 2015-11-08 DIAGNOSIS — I739 Peripheral vascular disease, unspecified: Secondary | ICD-10-CM | POA: Diagnosis not present

## 2015-11-08 DIAGNOSIS — E10621 Type 1 diabetes mellitus with foot ulcer: Secondary | ICD-10-CM | POA: Diagnosis not present

## 2015-11-09 DIAGNOSIS — I739 Peripheral vascular disease, unspecified: Secondary | ICD-10-CM | POA: Diagnosis not present

## 2015-11-09 DIAGNOSIS — E10621 Type 1 diabetes mellitus with foot ulcer: Secondary | ICD-10-CM | POA: Diagnosis not present

## 2015-11-09 DIAGNOSIS — S81802A Unspecified open wound, left lower leg, initial encounter: Secondary | ICD-10-CM | POA: Diagnosis not present

## 2015-11-09 DIAGNOSIS — E11621 Type 2 diabetes mellitus with foot ulcer: Secondary | ICD-10-CM | POA: Diagnosis not present

## 2015-11-09 DIAGNOSIS — L97512 Non-pressure chronic ulcer of other part of right foot with fat layer exposed: Secondary | ICD-10-CM | POA: Diagnosis not present

## 2015-11-09 DIAGNOSIS — L97511 Non-pressure chronic ulcer of other part of right foot limited to breakdown of skin: Secondary | ICD-10-CM | POA: Diagnosis not present

## 2015-11-10 DIAGNOSIS — E10621 Type 1 diabetes mellitus with foot ulcer: Secondary | ICD-10-CM | POA: Diagnosis not present

## 2015-11-10 DIAGNOSIS — I739 Peripheral vascular disease, unspecified: Secondary | ICD-10-CM | POA: Diagnosis not present

## 2015-11-10 DIAGNOSIS — L97512 Non-pressure chronic ulcer of other part of right foot with fat layer exposed: Secondary | ICD-10-CM | POA: Diagnosis not present

## 2015-11-10 DIAGNOSIS — S81802A Unspecified open wound, left lower leg, initial encounter: Secondary | ICD-10-CM | POA: Diagnosis not present

## 2015-11-10 DIAGNOSIS — E11621 Type 2 diabetes mellitus with foot ulcer: Secondary | ICD-10-CM | POA: Diagnosis not present

## 2015-11-10 DIAGNOSIS — L97511 Non-pressure chronic ulcer of other part of right foot limited to breakdown of skin: Secondary | ICD-10-CM | POA: Diagnosis not present

## 2015-11-13 DIAGNOSIS — E11621 Type 2 diabetes mellitus with foot ulcer: Secondary | ICD-10-CM | POA: Diagnosis not present

## 2015-11-13 DIAGNOSIS — L97511 Non-pressure chronic ulcer of other part of right foot limited to breakdown of skin: Secondary | ICD-10-CM | POA: Diagnosis not present

## 2015-11-13 DIAGNOSIS — E10621 Type 1 diabetes mellitus with foot ulcer: Secondary | ICD-10-CM | POA: Diagnosis not present

## 2015-11-14 DIAGNOSIS — E11621 Type 2 diabetes mellitus with foot ulcer: Secondary | ICD-10-CM | POA: Diagnosis not present

## 2015-11-14 DIAGNOSIS — L97511 Non-pressure chronic ulcer of other part of right foot limited to breakdown of skin: Secondary | ICD-10-CM | POA: Diagnosis not present

## 2015-11-14 DIAGNOSIS — E10621 Type 1 diabetes mellitus with foot ulcer: Secondary | ICD-10-CM | POA: Diagnosis not present

## 2015-11-15 DIAGNOSIS — L97511 Non-pressure chronic ulcer of other part of right foot limited to breakdown of skin: Secondary | ICD-10-CM | POA: Diagnosis not present

## 2015-11-15 DIAGNOSIS — E10621 Type 1 diabetes mellitus with foot ulcer: Secondary | ICD-10-CM | POA: Diagnosis not present

## 2015-11-15 DIAGNOSIS — E11621 Type 2 diabetes mellitus with foot ulcer: Secondary | ICD-10-CM | POA: Diagnosis not present

## 2015-11-16 DIAGNOSIS — I739 Peripheral vascular disease, unspecified: Secondary | ICD-10-CM | POA: Diagnosis not present

## 2015-11-16 DIAGNOSIS — L97511 Non-pressure chronic ulcer of other part of right foot limited to breakdown of skin: Secondary | ICD-10-CM | POA: Diagnosis not present

## 2015-11-16 DIAGNOSIS — L97512 Non-pressure chronic ulcer of other part of right foot with fat layer exposed: Secondary | ICD-10-CM | POA: Diagnosis not present

## 2015-11-16 DIAGNOSIS — E10621 Type 1 diabetes mellitus with foot ulcer: Secondary | ICD-10-CM | POA: Diagnosis not present

## 2015-11-17 DIAGNOSIS — L97512 Non-pressure chronic ulcer of other part of right foot with fat layer exposed: Secondary | ICD-10-CM | POA: Diagnosis not present

## 2015-11-17 DIAGNOSIS — E10621 Type 1 diabetes mellitus with foot ulcer: Secondary | ICD-10-CM | POA: Diagnosis not present

## 2015-11-17 DIAGNOSIS — I739 Peripheral vascular disease, unspecified: Secondary | ICD-10-CM | POA: Diagnosis not present

## 2015-11-20 DIAGNOSIS — E10621 Type 1 diabetes mellitus with foot ulcer: Secondary | ICD-10-CM | POA: Diagnosis not present

## 2015-11-20 DIAGNOSIS — L97512 Non-pressure chronic ulcer of other part of right foot with fat layer exposed: Secondary | ICD-10-CM | POA: Diagnosis not present

## 2015-11-21 DIAGNOSIS — E10621 Type 1 diabetes mellitus with foot ulcer: Secondary | ICD-10-CM | POA: Diagnosis not present

## 2015-11-21 DIAGNOSIS — L97512 Non-pressure chronic ulcer of other part of right foot with fat layer exposed: Secondary | ICD-10-CM | POA: Diagnosis not present

## 2015-11-21 DIAGNOSIS — I739 Peripheral vascular disease, unspecified: Secondary | ICD-10-CM | POA: Diagnosis not present

## 2015-11-22 DIAGNOSIS — L97512 Non-pressure chronic ulcer of other part of right foot with fat layer exposed: Secondary | ICD-10-CM | POA: Diagnosis not present

## 2015-11-22 DIAGNOSIS — I739 Peripheral vascular disease, unspecified: Secondary | ICD-10-CM | POA: Diagnosis not present

## 2015-11-22 DIAGNOSIS — L97511 Non-pressure chronic ulcer of other part of right foot limited to breakdown of skin: Secondary | ICD-10-CM | POA: Diagnosis not present

## 2015-11-22 DIAGNOSIS — E11621 Type 2 diabetes mellitus with foot ulcer: Secondary | ICD-10-CM | POA: Diagnosis not present

## 2015-11-22 DIAGNOSIS — E10621 Type 1 diabetes mellitus with foot ulcer: Secondary | ICD-10-CM | POA: Diagnosis not present

## 2015-11-24 DIAGNOSIS — L97511 Non-pressure chronic ulcer of other part of right foot limited to breakdown of skin: Secondary | ICD-10-CM | POA: Diagnosis not present

## 2015-11-24 DIAGNOSIS — E10621 Type 1 diabetes mellitus with foot ulcer: Secondary | ICD-10-CM | POA: Diagnosis not present

## 2015-11-24 DIAGNOSIS — E11621 Type 2 diabetes mellitus with foot ulcer: Secondary | ICD-10-CM | POA: Diagnosis not present

## 2015-11-27 DIAGNOSIS — L97511 Non-pressure chronic ulcer of other part of right foot limited to breakdown of skin: Secondary | ICD-10-CM | POA: Diagnosis not present

## 2015-11-27 DIAGNOSIS — E10621 Type 1 diabetes mellitus with foot ulcer: Secondary | ICD-10-CM | POA: Diagnosis not present

## 2015-11-27 DIAGNOSIS — L97515 Non-pressure chronic ulcer of other part of right foot with muscle involvement without evidence of necrosis: Secondary | ICD-10-CM | POA: Diagnosis not present

## 2015-11-27 DIAGNOSIS — E11621 Type 2 diabetes mellitus with foot ulcer: Secondary | ICD-10-CM | POA: Diagnosis not present

## 2015-11-28 DIAGNOSIS — E11621 Type 2 diabetes mellitus with foot ulcer: Secondary | ICD-10-CM | POA: Diagnosis not present

## 2015-11-28 DIAGNOSIS — L84 Corns and callosities: Secondary | ICD-10-CM | POA: Diagnosis not present

## 2015-11-28 DIAGNOSIS — L97512 Non-pressure chronic ulcer of other part of right foot with fat layer exposed: Secondary | ICD-10-CM | POA: Diagnosis not present

## 2015-11-28 DIAGNOSIS — E10621 Type 1 diabetes mellitus with foot ulcer: Secondary | ICD-10-CM | POA: Diagnosis not present

## 2015-11-28 DIAGNOSIS — I739 Peripheral vascular disease, unspecified: Secondary | ICD-10-CM | POA: Diagnosis not present

## 2015-11-28 DIAGNOSIS — L97511 Non-pressure chronic ulcer of other part of right foot limited to breakdown of skin: Secondary | ICD-10-CM | POA: Diagnosis not present

## 2015-11-29 DIAGNOSIS — E10621 Type 1 diabetes mellitus with foot ulcer: Secondary | ICD-10-CM | POA: Diagnosis not present

## 2015-11-29 DIAGNOSIS — I739 Peripheral vascular disease, unspecified: Secondary | ICD-10-CM | POA: Diagnosis not present

## 2015-11-29 DIAGNOSIS — L97515 Non-pressure chronic ulcer of other part of right foot with muscle involvement without evidence of necrosis: Secondary | ICD-10-CM | POA: Diagnosis not present

## 2015-11-29 DIAGNOSIS — E11621 Type 2 diabetes mellitus with foot ulcer: Secondary | ICD-10-CM | POA: Diagnosis not present

## 2015-11-29 DIAGNOSIS — L97512 Non-pressure chronic ulcer of other part of right foot with fat layer exposed: Secondary | ICD-10-CM | POA: Diagnosis not present

## 2015-12-01 DIAGNOSIS — I739 Peripheral vascular disease, unspecified: Secondary | ICD-10-CM | POA: Diagnosis not present

## 2015-12-01 DIAGNOSIS — L97512 Non-pressure chronic ulcer of other part of right foot with fat layer exposed: Secondary | ICD-10-CM | POA: Diagnosis not present

## 2015-12-01 DIAGNOSIS — L97511 Non-pressure chronic ulcer of other part of right foot limited to breakdown of skin: Secondary | ICD-10-CM | POA: Diagnosis not present

## 2015-12-01 DIAGNOSIS — E11621 Type 2 diabetes mellitus with foot ulcer: Secondary | ICD-10-CM | POA: Diagnosis not present

## 2015-12-01 DIAGNOSIS — E10621 Type 1 diabetes mellitus with foot ulcer: Secondary | ICD-10-CM | POA: Diagnosis not present

## 2015-12-04 DIAGNOSIS — E10621 Type 1 diabetes mellitus with foot ulcer: Secondary | ICD-10-CM | POA: Diagnosis not present

## 2015-12-04 DIAGNOSIS — L97511 Non-pressure chronic ulcer of other part of right foot limited to breakdown of skin: Secondary | ICD-10-CM | POA: Diagnosis not present

## 2015-12-04 DIAGNOSIS — L97512 Non-pressure chronic ulcer of other part of right foot with fat layer exposed: Secondary | ICD-10-CM | POA: Diagnosis not present

## 2015-12-04 DIAGNOSIS — E11621 Type 2 diabetes mellitus with foot ulcer: Secondary | ICD-10-CM | POA: Diagnosis not present

## 2015-12-04 DIAGNOSIS — I739 Peripheral vascular disease, unspecified: Secondary | ICD-10-CM | POA: Diagnosis not present

## 2015-12-06 DIAGNOSIS — L97512 Non-pressure chronic ulcer of other part of right foot with fat layer exposed: Secondary | ICD-10-CM | POA: Diagnosis not present

## 2015-12-06 DIAGNOSIS — E10621 Type 1 diabetes mellitus with foot ulcer: Secondary | ICD-10-CM | POA: Diagnosis not present

## 2015-12-06 DIAGNOSIS — I739 Peripheral vascular disease, unspecified: Secondary | ICD-10-CM | POA: Diagnosis not present

## 2015-12-06 DIAGNOSIS — E11621 Type 2 diabetes mellitus with foot ulcer: Secondary | ICD-10-CM | POA: Diagnosis not present

## 2015-12-06 DIAGNOSIS — L97515 Non-pressure chronic ulcer of other part of right foot with muscle involvement without evidence of necrosis: Secondary | ICD-10-CM | POA: Diagnosis not present

## 2015-12-07 DIAGNOSIS — E10621 Type 1 diabetes mellitus with foot ulcer: Secondary | ICD-10-CM | POA: Diagnosis not present

## 2015-12-07 DIAGNOSIS — L97515 Non-pressure chronic ulcer of other part of right foot with muscle involvement without evidence of necrosis: Secondary | ICD-10-CM | POA: Diagnosis not present

## 2015-12-07 DIAGNOSIS — I739 Peripheral vascular disease, unspecified: Secondary | ICD-10-CM | POA: Diagnosis not present

## 2015-12-07 DIAGNOSIS — L97512 Non-pressure chronic ulcer of other part of right foot with fat layer exposed: Secondary | ICD-10-CM | POA: Diagnosis not present

## 2015-12-08 DIAGNOSIS — E10621 Type 1 diabetes mellitus with foot ulcer: Secondary | ICD-10-CM | POA: Diagnosis not present

## 2015-12-08 DIAGNOSIS — I739 Peripheral vascular disease, unspecified: Secondary | ICD-10-CM | POA: Diagnosis not present

## 2015-12-08 DIAGNOSIS — L97512 Non-pressure chronic ulcer of other part of right foot with fat layer exposed: Secondary | ICD-10-CM | POA: Diagnosis not present

## 2015-12-08 DIAGNOSIS — S81802A Unspecified open wound, left lower leg, initial encounter: Secondary | ICD-10-CM | POA: Diagnosis not present

## 2015-12-08 DIAGNOSIS — L97515 Non-pressure chronic ulcer of other part of right foot with muscle involvement without evidence of necrosis: Secondary | ICD-10-CM | POA: Diagnosis not present

## 2015-12-11 DIAGNOSIS — E10621 Type 1 diabetes mellitus with foot ulcer: Secondary | ICD-10-CM | POA: Diagnosis not present

## 2015-12-11 DIAGNOSIS — L97515 Non-pressure chronic ulcer of other part of right foot with muscle involvement without evidence of necrosis: Secondary | ICD-10-CM | POA: Diagnosis not present

## 2015-12-11 DIAGNOSIS — L97512 Non-pressure chronic ulcer of other part of right foot with fat layer exposed: Secondary | ICD-10-CM | POA: Diagnosis not present

## 2015-12-11 DIAGNOSIS — I739 Peripheral vascular disease, unspecified: Secondary | ICD-10-CM | POA: Diagnosis not present

## 2015-12-12 DIAGNOSIS — L97512 Non-pressure chronic ulcer of other part of right foot with fat layer exposed: Secondary | ICD-10-CM | POA: Diagnosis not present

## 2015-12-12 DIAGNOSIS — I739 Peripheral vascular disease, unspecified: Secondary | ICD-10-CM | POA: Diagnosis not present

## 2015-12-12 DIAGNOSIS — E10621 Type 1 diabetes mellitus with foot ulcer: Secondary | ICD-10-CM | POA: Diagnosis not present

## 2015-12-19 DIAGNOSIS — I739 Peripheral vascular disease, unspecified: Secondary | ICD-10-CM | POA: Diagnosis not present

## 2015-12-19 DIAGNOSIS — L97512 Non-pressure chronic ulcer of other part of right foot with fat layer exposed: Secondary | ICD-10-CM | POA: Diagnosis not present

## 2015-12-19 DIAGNOSIS — E10621 Type 1 diabetes mellitus with foot ulcer: Secondary | ICD-10-CM | POA: Diagnosis not present

## 2015-12-28 DIAGNOSIS — H409 Unspecified glaucoma: Secondary | ICD-10-CM | POA: Diagnosis not present

## 2015-12-28 DIAGNOSIS — E10621 Type 1 diabetes mellitus with foot ulcer: Secondary | ICD-10-CM | POA: Diagnosis not present

## 2015-12-28 DIAGNOSIS — G629 Polyneuropathy, unspecified: Secondary | ICD-10-CM | POA: Diagnosis not present

## 2015-12-28 DIAGNOSIS — L97511 Non-pressure chronic ulcer of other part of right foot limited to breakdown of skin: Secondary | ICD-10-CM | POA: Diagnosis not present

## 2015-12-28 DIAGNOSIS — I10 Essential (primary) hypertension: Secondary | ICD-10-CM | POA: Diagnosis not present

## 2015-12-28 DIAGNOSIS — G40909 Epilepsy, unspecified, not intractable, without status epilepticus: Secondary | ICD-10-CM | POA: Diagnosis not present

## 2015-12-28 DIAGNOSIS — I739 Peripheral vascular disease, unspecified: Secondary | ICD-10-CM | POA: Diagnosis not present

## 2016-01-02 DIAGNOSIS — I739 Peripheral vascular disease, unspecified: Secondary | ICD-10-CM | POA: Diagnosis not present

## 2016-01-02 DIAGNOSIS — L97919 Non-pressure chronic ulcer of unspecified part of right lower leg with unspecified severity: Secondary | ICD-10-CM | POA: Diagnosis not present

## 2016-01-02 DIAGNOSIS — L97509 Non-pressure chronic ulcer of other part of unspecified foot with unspecified severity: Secondary | ICD-10-CM | POA: Diagnosis not present

## 2016-01-03 DIAGNOSIS — I739 Peripheral vascular disease, unspecified: Secondary | ICD-10-CM | POA: Diagnosis not present

## 2016-01-03 DIAGNOSIS — L97512 Non-pressure chronic ulcer of other part of right foot with fat layer exposed: Secondary | ICD-10-CM | POA: Diagnosis not present

## 2016-01-03 DIAGNOSIS — E10621 Type 1 diabetes mellitus with foot ulcer: Secondary | ICD-10-CM | POA: Diagnosis not present

## 2016-01-03 DIAGNOSIS — L97511 Non-pressure chronic ulcer of other part of right foot limited to breakdown of skin: Secondary | ICD-10-CM | POA: Diagnosis not present

## 2016-01-05 DIAGNOSIS — E890 Postprocedural hypothyroidism: Secondary | ICD-10-CM | POA: Diagnosis not present

## 2016-01-05 DIAGNOSIS — E1042 Type 1 diabetes mellitus with diabetic polyneuropathy: Secondary | ICD-10-CM | POA: Diagnosis not present

## 2016-01-05 DIAGNOSIS — I10 Essential (primary) hypertension: Secondary | ICD-10-CM | POA: Diagnosis not present

## 2016-01-05 DIAGNOSIS — E1065 Type 1 diabetes mellitus with hyperglycemia: Secondary | ICD-10-CM | POA: Diagnosis not present

## 2016-01-09 DIAGNOSIS — E890 Postprocedural hypothyroidism: Secondary | ICD-10-CM | POA: Diagnosis not present

## 2016-01-09 DIAGNOSIS — E1042 Type 1 diabetes mellitus with diabetic polyneuropathy: Secondary | ICD-10-CM | POA: Diagnosis not present

## 2016-01-09 DIAGNOSIS — E1065 Type 1 diabetes mellitus with hyperglycemia: Secondary | ICD-10-CM | POA: Diagnosis not present

## 2016-01-15 DIAGNOSIS — I739 Peripheral vascular disease, unspecified: Secondary | ICD-10-CM | POA: Diagnosis not present

## 2016-01-15 DIAGNOSIS — E11621 Type 2 diabetes mellitus with foot ulcer: Secondary | ICD-10-CM | POA: Diagnosis not present

## 2016-01-15 DIAGNOSIS — S81802A Unspecified open wound, left lower leg, initial encounter: Secondary | ICD-10-CM | POA: Diagnosis not present

## 2016-01-15 DIAGNOSIS — L97512 Non-pressure chronic ulcer of other part of right foot with fat layer exposed: Secondary | ICD-10-CM | POA: Diagnosis not present

## 2016-01-15 DIAGNOSIS — E10621 Type 1 diabetes mellitus with foot ulcer: Secondary | ICD-10-CM | POA: Diagnosis not present

## 2016-01-15 DIAGNOSIS — L97511 Non-pressure chronic ulcer of other part of right foot limited to breakdown of skin: Secondary | ICD-10-CM | POA: Diagnosis not present

## 2016-01-23 DIAGNOSIS — L97522 Non-pressure chronic ulcer of other part of left foot with fat layer exposed: Secondary | ICD-10-CM | POA: Diagnosis not present

## 2016-01-23 DIAGNOSIS — H409 Unspecified glaucoma: Secondary | ICD-10-CM | POA: Diagnosis not present

## 2016-01-23 DIAGNOSIS — I10 Essential (primary) hypertension: Secondary | ICD-10-CM | POA: Diagnosis not present

## 2016-01-23 DIAGNOSIS — G40909 Epilepsy, unspecified, not intractable, without status epilepticus: Secondary | ICD-10-CM | POA: Diagnosis not present

## 2016-01-23 DIAGNOSIS — E10621 Type 1 diabetes mellitus with foot ulcer: Secondary | ICD-10-CM | POA: Diagnosis not present

## 2016-01-23 DIAGNOSIS — Z79899 Other long term (current) drug therapy: Secondary | ICD-10-CM | POA: Diagnosis not present

## 2016-01-23 DIAGNOSIS — G629 Polyneuropathy, unspecified: Secondary | ICD-10-CM | POA: Diagnosis not present

## 2016-01-23 DIAGNOSIS — I739 Peripheral vascular disease, unspecified: Secondary | ICD-10-CM | POA: Diagnosis not present

## 2016-01-23 DIAGNOSIS — L97512 Non-pressure chronic ulcer of other part of right foot with fat layer exposed: Secondary | ICD-10-CM | POA: Diagnosis not present

## 2016-01-30 DIAGNOSIS — E11621 Type 2 diabetes mellitus with foot ulcer: Secondary | ICD-10-CM | POA: Diagnosis not present

## 2016-01-30 DIAGNOSIS — L97521 Non-pressure chronic ulcer of other part of left foot limited to breakdown of skin: Secondary | ICD-10-CM | POA: Diagnosis not present

## 2016-01-30 DIAGNOSIS — L97512 Non-pressure chronic ulcer of other part of right foot with fat layer exposed: Secondary | ICD-10-CM | POA: Diagnosis not present

## 2016-01-30 DIAGNOSIS — L97511 Non-pressure chronic ulcer of other part of right foot limited to breakdown of skin: Secondary | ICD-10-CM | POA: Diagnosis not present

## 2016-01-30 DIAGNOSIS — I739 Peripheral vascular disease, unspecified: Secondary | ICD-10-CM | POA: Diagnosis not present

## 2016-01-30 DIAGNOSIS — L97522 Non-pressure chronic ulcer of other part of left foot with fat layer exposed: Secondary | ICD-10-CM | POA: Diagnosis not present

## 2016-01-30 DIAGNOSIS — E10621 Type 1 diabetes mellitus with foot ulcer: Secondary | ICD-10-CM | POA: Diagnosis not present

## 2016-02-07 DIAGNOSIS — L97521 Non-pressure chronic ulcer of other part of left foot limited to breakdown of skin: Secondary | ICD-10-CM | POA: Diagnosis not present

## 2016-02-07 DIAGNOSIS — I739 Peripheral vascular disease, unspecified: Secondary | ICD-10-CM | POA: Diagnosis not present

## 2016-02-07 DIAGNOSIS — L97512 Non-pressure chronic ulcer of other part of right foot with fat layer exposed: Secondary | ICD-10-CM | POA: Diagnosis not present

## 2016-02-07 DIAGNOSIS — E10621 Type 1 diabetes mellitus with foot ulcer: Secondary | ICD-10-CM | POA: Diagnosis not present

## 2016-02-07 DIAGNOSIS — L97522 Non-pressure chronic ulcer of other part of left foot with fat layer exposed: Secondary | ICD-10-CM | POA: Diagnosis not present

## 2016-02-07 DIAGNOSIS — L97511 Non-pressure chronic ulcer of other part of right foot limited to breakdown of skin: Secondary | ICD-10-CM | POA: Diagnosis not present

## 2016-02-08 DIAGNOSIS — E1065 Type 1 diabetes mellitus with hyperglycemia: Secondary | ICD-10-CM | POA: Diagnosis not present

## 2016-02-13 DIAGNOSIS — L97511 Non-pressure chronic ulcer of other part of right foot limited to breakdown of skin: Secondary | ICD-10-CM | POA: Diagnosis not present

## 2016-02-13 DIAGNOSIS — E10621 Type 1 diabetes mellitus with foot ulcer: Secondary | ICD-10-CM | POA: Diagnosis not present

## 2016-02-13 DIAGNOSIS — I739 Peripheral vascular disease, unspecified: Secondary | ICD-10-CM | POA: Diagnosis not present

## 2016-02-13 DIAGNOSIS — L97522 Non-pressure chronic ulcer of other part of left foot with fat layer exposed: Secondary | ICD-10-CM | POA: Diagnosis not present

## 2016-02-13 DIAGNOSIS — L97512 Non-pressure chronic ulcer of other part of right foot with fat layer exposed: Secondary | ICD-10-CM | POA: Diagnosis not present

## 2016-02-13 DIAGNOSIS — L97521 Non-pressure chronic ulcer of other part of left foot limited to breakdown of skin: Secondary | ICD-10-CM | POA: Diagnosis not present

## 2016-02-15 DIAGNOSIS — L97519 Non-pressure chronic ulcer of other part of right foot with unspecified severity: Secondary | ICD-10-CM | POA: Diagnosis not present

## 2016-02-15 DIAGNOSIS — I7092 Chronic total occlusion of artery of the extremities: Secondary | ICD-10-CM | POA: Diagnosis not present

## 2016-02-15 DIAGNOSIS — I739 Peripheral vascular disease, unspecified: Secondary | ICD-10-CM | POA: Diagnosis not present

## 2016-02-15 DIAGNOSIS — I70201 Unspecified atherosclerosis of native arteries of extremities, right leg: Secondary | ICD-10-CM | POA: Diagnosis not present

## 2016-02-15 DIAGNOSIS — M868X7 Other osteomyelitis, ankle and foot: Secondary | ICD-10-CM | POA: Diagnosis not present

## 2016-02-15 DIAGNOSIS — E10621 Type 1 diabetes mellitus with foot ulcer: Secondary | ICD-10-CM | POA: Diagnosis not present

## 2016-02-19 DIAGNOSIS — I771 Stricture of artery: Secondary | ICD-10-CM | POA: Diagnosis not present

## 2016-02-19 DIAGNOSIS — I70201 Unspecified atherosclerosis of native arteries of extremities, right leg: Secondary | ICD-10-CM | POA: Diagnosis not present

## 2016-02-20 DIAGNOSIS — E11621 Type 2 diabetes mellitus with foot ulcer: Secondary | ICD-10-CM | POA: Diagnosis not present

## 2016-02-20 DIAGNOSIS — G629 Polyneuropathy, unspecified: Secondary | ICD-10-CM | POA: Diagnosis not present

## 2016-02-20 DIAGNOSIS — E10621 Type 1 diabetes mellitus with foot ulcer: Secondary | ICD-10-CM | POA: Diagnosis not present

## 2016-02-20 DIAGNOSIS — L97522 Non-pressure chronic ulcer of other part of left foot with fat layer exposed: Secondary | ICD-10-CM | POA: Diagnosis not present

## 2016-02-20 DIAGNOSIS — S81802A Unspecified open wound, left lower leg, initial encounter: Secondary | ICD-10-CM | POA: Diagnosis not present

## 2016-02-20 DIAGNOSIS — I10 Essential (primary) hypertension: Secondary | ICD-10-CM | POA: Diagnosis not present

## 2016-02-20 DIAGNOSIS — I739 Peripheral vascular disease, unspecified: Secondary | ICD-10-CM | POA: Diagnosis not present

## 2016-02-20 DIAGNOSIS — L97512 Non-pressure chronic ulcer of other part of right foot with fat layer exposed: Secondary | ICD-10-CM | POA: Diagnosis not present

## 2016-02-20 DIAGNOSIS — G40909 Epilepsy, unspecified, not intractable, without status epilepticus: Secondary | ICD-10-CM | POA: Diagnosis not present

## 2016-02-23 DIAGNOSIS — E10621 Type 1 diabetes mellitus with foot ulcer: Secondary | ICD-10-CM | POA: Diagnosis not present

## 2016-02-23 DIAGNOSIS — R3911 Hesitancy of micturition: Secondary | ICD-10-CM | POA: Diagnosis not present

## 2016-02-23 DIAGNOSIS — Z6821 Body mass index (BMI) 21.0-21.9, adult: Secondary | ICD-10-CM | POA: Diagnosis not present

## 2016-02-23 DIAGNOSIS — E039 Hypothyroidism, unspecified: Secondary | ICD-10-CM | POA: Diagnosis not present

## 2016-02-23 DIAGNOSIS — I10 Essential (primary) hypertension: Secondary | ICD-10-CM | POA: Diagnosis not present

## 2016-02-23 DIAGNOSIS — J302 Other seasonal allergic rhinitis: Secondary | ICD-10-CM | POA: Diagnosis not present

## 2016-02-23 DIAGNOSIS — E10319 Type 1 diabetes mellitus with unspecified diabetic retinopathy without macular edema: Secondary | ICD-10-CM | POA: Diagnosis not present

## 2016-02-23 DIAGNOSIS — E1040 Type 1 diabetes mellitus with diabetic neuropathy, unspecified: Secondary | ICD-10-CM | POA: Diagnosis not present

## 2016-02-23 DIAGNOSIS — E7800 Pure hypercholesterolemia, unspecified: Secondary | ICD-10-CM | POA: Diagnosis not present

## 2016-02-23 DIAGNOSIS — I739 Peripheral vascular disease, unspecified: Secondary | ICD-10-CM | POA: Diagnosis not present

## 2016-02-23 DIAGNOSIS — Z681 Body mass index (BMI) 19 or less, adult: Secondary | ICD-10-CM | POA: Diagnosis not present

## 2016-02-23 DIAGNOSIS — Z1389 Encounter for screening for other disorder: Secondary | ICD-10-CM | POA: Diagnosis not present

## 2016-02-23 DIAGNOSIS — R809 Proteinuria, unspecified: Secondary | ICD-10-CM | POA: Diagnosis not present

## 2016-02-23 DIAGNOSIS — L97509 Non-pressure chronic ulcer of other part of unspecified foot with unspecified severity: Secondary | ICD-10-CM | POA: Diagnosis not present

## 2016-02-27 DIAGNOSIS — S81802D Unspecified open wound, left lower leg, subsequent encounter: Secondary | ICD-10-CM | POA: Diagnosis not present

## 2016-02-27 DIAGNOSIS — G40909 Epilepsy, unspecified, not intractable, without status epilepticus: Secondary | ICD-10-CM | POA: Diagnosis not present

## 2016-02-27 DIAGNOSIS — E10621 Type 1 diabetes mellitus with foot ulcer: Secondary | ICD-10-CM | POA: Diagnosis not present

## 2016-02-27 DIAGNOSIS — I10 Essential (primary) hypertension: Secondary | ICD-10-CM | POA: Diagnosis not present

## 2016-02-27 DIAGNOSIS — L97512 Non-pressure chronic ulcer of other part of right foot with fat layer exposed: Secondary | ICD-10-CM | POA: Diagnosis not present

## 2016-02-27 DIAGNOSIS — I739 Peripheral vascular disease, unspecified: Secondary | ICD-10-CM | POA: Diagnosis not present

## 2016-02-27 DIAGNOSIS — G629 Polyneuropathy, unspecified: Secondary | ICD-10-CM | POA: Diagnosis not present

## 2016-03-04 DIAGNOSIS — I739 Peripheral vascular disease, unspecified: Secondary | ICD-10-CM | POA: Diagnosis not present

## 2016-03-04 DIAGNOSIS — Z8679 Personal history of other diseases of the circulatory system: Secondary | ICD-10-CM | POA: Diagnosis not present

## 2016-03-04 DIAGNOSIS — I771 Stricture of artery: Secondary | ICD-10-CM | POA: Diagnosis not present

## 2016-03-05 DIAGNOSIS — E11621 Type 2 diabetes mellitus with foot ulcer: Secondary | ICD-10-CM | POA: Diagnosis not present

## 2016-03-05 DIAGNOSIS — I739 Peripheral vascular disease, unspecified: Secondary | ICD-10-CM | POA: Diagnosis not present

## 2016-03-05 DIAGNOSIS — L97511 Non-pressure chronic ulcer of other part of right foot limited to breakdown of skin: Secondary | ICD-10-CM | POA: Diagnosis not present

## 2016-03-05 DIAGNOSIS — L97512 Non-pressure chronic ulcer of other part of right foot with fat layer exposed: Secondary | ICD-10-CM | POA: Diagnosis not present

## 2016-03-12 DIAGNOSIS — L97511 Non-pressure chronic ulcer of other part of right foot limited to breakdown of skin: Secondary | ICD-10-CM | POA: Diagnosis not present

## 2016-03-12 DIAGNOSIS — I739 Peripheral vascular disease, unspecified: Secondary | ICD-10-CM | POA: Diagnosis not present

## 2016-03-12 DIAGNOSIS — L089 Local infection of the skin and subcutaneous tissue, unspecified: Secondary | ICD-10-CM | POA: Diagnosis not present

## 2016-03-12 DIAGNOSIS — L97512 Non-pressure chronic ulcer of other part of right foot with fat layer exposed: Secondary | ICD-10-CM | POA: Diagnosis not present

## 2016-03-12 DIAGNOSIS — E11621 Type 2 diabetes mellitus with foot ulcer: Secondary | ICD-10-CM | POA: Diagnosis not present

## 2016-03-17 DIAGNOSIS — Z1211 Encounter for screening for malignant neoplasm of colon: Secondary | ICD-10-CM | POA: Diagnosis not present

## 2016-03-17 DIAGNOSIS — Z1212 Encounter for screening for malignant neoplasm of rectum: Secondary | ICD-10-CM | POA: Diagnosis not present

## 2016-03-19 DIAGNOSIS — E11621 Type 2 diabetes mellitus with foot ulcer: Secondary | ICD-10-CM | POA: Diagnosis not present

## 2016-03-19 DIAGNOSIS — I96 Gangrene, not elsewhere classified: Secondary | ICD-10-CM | POA: Diagnosis not present

## 2016-03-19 DIAGNOSIS — E10621 Type 1 diabetes mellitus with foot ulcer: Secondary | ICD-10-CM | POA: Diagnosis not present

## 2016-03-19 DIAGNOSIS — I739 Peripheral vascular disease, unspecified: Secondary | ICD-10-CM | POA: Diagnosis not present

## 2016-03-19 DIAGNOSIS — L97511 Non-pressure chronic ulcer of other part of right foot limited to breakdown of skin: Secondary | ICD-10-CM | POA: Diagnosis not present

## 2016-03-26 DIAGNOSIS — I739 Peripheral vascular disease, unspecified: Secondary | ICD-10-CM | POA: Diagnosis not present

## 2016-03-26 DIAGNOSIS — E11621 Type 2 diabetes mellitus with foot ulcer: Secondary | ICD-10-CM | POA: Diagnosis not present

## 2016-03-26 DIAGNOSIS — L97511 Non-pressure chronic ulcer of other part of right foot limited to breakdown of skin: Secondary | ICD-10-CM | POA: Diagnosis not present

## 2016-03-26 DIAGNOSIS — E10621 Type 1 diabetes mellitus with foot ulcer: Secondary | ICD-10-CM | POA: Diagnosis not present

## 2016-04-02 DIAGNOSIS — I739 Peripheral vascular disease, unspecified: Secondary | ICD-10-CM | POA: Insufficient documentation

## 2016-04-02 DIAGNOSIS — Z794 Long term (current) use of insulin: Secondary | ICD-10-CM | POA: Diagnosis not present

## 2016-04-02 DIAGNOSIS — M86672 Other chronic osteomyelitis, left ankle and foot: Secondary | ICD-10-CM | POA: Diagnosis not present

## 2016-04-02 DIAGNOSIS — Z7901 Long term (current) use of anticoagulants: Secondary | ICD-10-CM | POA: Diagnosis not present

## 2016-04-02 DIAGNOSIS — R0989 Other specified symptoms and signs involving the circulatory and respiratory systems: Secondary | ICD-10-CM | POA: Diagnosis not present

## 2016-04-02 DIAGNOSIS — Z9641 Presence of insulin pump (external) (internal): Secondary | ICD-10-CM | POA: Diagnosis not present

## 2016-04-02 DIAGNOSIS — M869 Osteomyelitis, unspecified: Secondary | ICD-10-CM | POA: Insufficient documentation

## 2016-04-02 DIAGNOSIS — Z7902 Long term (current) use of antithrombotics/antiplatelets: Secondary | ICD-10-CM | POA: Diagnosis not present

## 2016-04-02 DIAGNOSIS — E1069 Type 1 diabetes mellitus with other specified complication: Secondary | ICD-10-CM | POA: Diagnosis not present

## 2016-04-02 DIAGNOSIS — S92411A Displaced fracture of proximal phalanx of right great toe, initial encounter for closed fracture: Secondary | ICD-10-CM | POA: Diagnosis not present

## 2016-04-02 DIAGNOSIS — E1051 Type 1 diabetes mellitus with diabetic peripheral angiopathy without gangrene: Secondary | ICD-10-CM | POA: Diagnosis not present

## 2016-04-02 DIAGNOSIS — I70261 Atherosclerosis of native arteries of extremities with gangrene, right leg: Secondary | ICD-10-CM | POA: Diagnosis not present

## 2016-04-02 DIAGNOSIS — E119 Type 2 diabetes mellitus without complications: Secondary | ICD-10-CM | POA: Diagnosis not present

## 2016-04-02 DIAGNOSIS — Z7982 Long term (current) use of aspirin: Secondary | ICD-10-CM | POA: Diagnosis not present

## 2016-04-02 DIAGNOSIS — A480 Gas gangrene: Secondary | ICD-10-CM | POA: Diagnosis not present

## 2016-04-02 DIAGNOSIS — I10 Essential (primary) hypertension: Secondary | ICD-10-CM | POA: Diagnosis not present

## 2016-04-02 DIAGNOSIS — E039 Hypothyroidism, unspecified: Secondary | ICD-10-CM | POA: Diagnosis not present

## 2016-04-02 DIAGNOSIS — M86171 Other acute osteomyelitis, right ankle and foot: Secondary | ICD-10-CM | POA: Diagnosis not present

## 2016-04-02 DIAGNOSIS — I96 Gangrene, not elsewhere classified: Secondary | ICD-10-CM | POA: Diagnosis not present

## 2016-04-02 DIAGNOSIS — E109 Type 1 diabetes mellitus without complications: Secondary | ICD-10-CM | POA: Diagnosis not present

## 2016-04-02 DIAGNOSIS — E1052 Type 1 diabetes mellitus with diabetic peripheral angiopathy with gangrene: Secondary | ICD-10-CM | POA: Diagnosis not present

## 2016-04-02 DIAGNOSIS — M86671 Other chronic osteomyelitis, right ankle and foot: Secondary | ICD-10-CM | POA: Diagnosis not present

## 2016-04-02 DIAGNOSIS — S92404B Nondisplaced unspecified fracture of right great toe, initial encounter for open fracture: Secondary | ICD-10-CM | POA: Diagnosis not present

## 2016-04-02 DIAGNOSIS — I70239 Atherosclerosis of native arteries of right leg with ulceration of unspecified site: Secondary | ICD-10-CM | POA: Diagnosis not present

## 2016-04-03 ENCOUNTER — Ambulatory Visit: Payer: Self-pay | Admitting: Sports Medicine

## 2016-04-03 DIAGNOSIS — M86672 Other chronic osteomyelitis, left ankle and foot: Secondary | ICD-10-CM | POA: Insufficient documentation

## 2016-04-11 DIAGNOSIS — I10 Essential (primary) hypertension: Secondary | ICD-10-CM | POA: Diagnosis not present

## 2016-04-11 DIAGNOSIS — E1042 Type 1 diabetes mellitus with diabetic polyneuropathy: Secondary | ICD-10-CM | POA: Diagnosis not present

## 2016-04-11 DIAGNOSIS — E890 Postprocedural hypothyroidism: Secondary | ICD-10-CM | POA: Diagnosis not present

## 2016-04-11 DIAGNOSIS — E1065 Type 1 diabetes mellitus with hyperglycemia: Secondary | ICD-10-CM | POA: Diagnosis not present

## 2016-04-12 DIAGNOSIS — E11621 Type 2 diabetes mellitus with foot ulcer: Secondary | ICD-10-CM | POA: Diagnosis not present

## 2016-04-12 DIAGNOSIS — L97509 Non-pressure chronic ulcer of other part of unspecified foot with unspecified severity: Secondary | ICD-10-CM | POA: Diagnosis not present

## 2016-04-25 DIAGNOSIS — Z89411 Acquired absence of right great toe: Secondary | ICD-10-CM | POA: Insufficient documentation

## 2016-04-25 DIAGNOSIS — I70261 Atherosclerosis of native arteries of extremities with gangrene, right leg: Secondary | ICD-10-CM | POA: Diagnosis not present

## 2016-05-24 DIAGNOSIS — I70203 Unspecified atherosclerosis of native arteries of extremities, bilateral legs: Secondary | ICD-10-CM | POA: Diagnosis not present

## 2016-05-28 DIAGNOSIS — E1065 Type 1 diabetes mellitus with hyperglycemia: Secondary | ICD-10-CM | POA: Diagnosis not present

## 2016-05-28 DIAGNOSIS — E162 Hypoglycemia, unspecified: Secondary | ICD-10-CM | POA: Diagnosis not present

## 2016-05-28 DIAGNOSIS — E10319 Type 1 diabetes mellitus with unspecified diabetic retinopathy without macular edema: Secondary | ICD-10-CM | POA: Diagnosis not present

## 2016-05-28 DIAGNOSIS — E1040 Type 1 diabetes mellitus with diabetic neuropathy, unspecified: Secondary | ICD-10-CM | POA: Diagnosis not present

## 2016-06-12 DIAGNOSIS — Z89411 Acquired absence of right great toe: Secondary | ICD-10-CM | POA: Diagnosis not present

## 2016-06-12 DIAGNOSIS — I739 Peripheral vascular disease, unspecified: Secondary | ICD-10-CM | POA: Diagnosis not present

## 2016-06-12 DIAGNOSIS — E1042 Type 1 diabetes mellitus with diabetic polyneuropathy: Secondary | ICD-10-CM | POA: Diagnosis not present

## 2016-06-12 DIAGNOSIS — E1065 Type 1 diabetes mellitus with hyperglycemia: Secondary | ICD-10-CM | POA: Diagnosis not present

## 2016-07-12 DIAGNOSIS — I10 Essential (primary) hypertension: Secondary | ICD-10-CM | POA: Diagnosis not present

## 2016-07-12 DIAGNOSIS — E1051 Type 1 diabetes mellitus with diabetic peripheral angiopathy without gangrene: Secondary | ICD-10-CM | POA: Diagnosis not present

## 2016-07-12 DIAGNOSIS — E1042 Type 1 diabetes mellitus with diabetic polyneuropathy: Secondary | ICD-10-CM | POA: Diagnosis not present

## 2016-07-12 DIAGNOSIS — E1065 Type 1 diabetes mellitus with hyperglycemia: Secondary | ICD-10-CM | POA: Diagnosis not present

## 2016-07-12 DIAGNOSIS — E890 Postprocedural hypothyroidism: Secondary | ICD-10-CM | POA: Diagnosis not present

## 2016-07-29 DIAGNOSIS — E1051 Type 1 diabetes mellitus with diabetic peripheral angiopathy without gangrene: Secondary | ICD-10-CM | POA: Diagnosis not present

## 2016-07-29 DIAGNOSIS — Z89411 Acquired absence of right great toe: Secondary | ICD-10-CM | POA: Diagnosis not present

## 2016-07-29 DIAGNOSIS — I739 Peripheral vascular disease, unspecified: Secondary | ICD-10-CM | POA: Diagnosis not present

## 2016-09-03 DIAGNOSIS — I951 Orthostatic hypotension: Secondary | ICD-10-CM | POA: Diagnosis not present

## 2016-09-03 DIAGNOSIS — E1065 Type 1 diabetes mellitus with hyperglycemia: Secondary | ICD-10-CM | POA: Diagnosis not present

## 2016-09-03 DIAGNOSIS — I10 Essential (primary) hypertension: Secondary | ICD-10-CM | POA: Diagnosis not present

## 2016-09-16 DIAGNOSIS — I739 Peripheral vascular disease, unspecified: Secondary | ICD-10-CM | POA: Diagnosis not present

## 2016-10-03 DIAGNOSIS — I70203 Unspecified atherosclerosis of native arteries of extremities, bilateral legs: Secondary | ICD-10-CM | POA: Diagnosis not present

## 2016-10-03 DIAGNOSIS — E1042 Type 1 diabetes mellitus with diabetic polyneuropathy: Secondary | ICD-10-CM | POA: Diagnosis not present

## 2016-10-03 DIAGNOSIS — E1065 Type 1 diabetes mellitus with hyperglycemia: Secondary | ICD-10-CM | POA: Diagnosis not present

## 2016-10-03 DIAGNOSIS — Z89411 Acquired absence of right great toe: Secondary | ICD-10-CM | POA: Diagnosis not present

## 2016-10-03 DIAGNOSIS — I10 Essential (primary) hypertension: Secondary | ICD-10-CM | POA: Diagnosis not present

## 2016-10-17 DIAGNOSIS — E1042 Type 1 diabetes mellitus with diabetic polyneuropathy: Secondary | ICD-10-CM | POA: Diagnosis not present

## 2016-10-17 DIAGNOSIS — I10 Essential (primary) hypertension: Secondary | ICD-10-CM | POA: Diagnosis not present

## 2016-10-17 DIAGNOSIS — E890 Postprocedural hypothyroidism: Secondary | ICD-10-CM | POA: Diagnosis not present

## 2016-10-17 DIAGNOSIS — E1065 Type 1 diabetes mellitus with hyperglycemia: Secondary | ICD-10-CM | POA: Diagnosis not present

## 2016-10-17 DIAGNOSIS — I739 Peripheral vascular disease, unspecified: Secondary | ICD-10-CM | POA: Diagnosis not present

## 2016-10-17 DIAGNOSIS — E1051 Type 1 diabetes mellitus with diabetic peripheral angiopathy without gangrene: Secondary | ICD-10-CM | POA: Diagnosis not present

## 2016-10-24 DIAGNOSIS — E103493 Type 1 diabetes mellitus with severe nonproliferative diabetic retinopathy without macular edema, bilateral: Secondary | ICD-10-CM | POA: Diagnosis not present

## 2016-10-24 DIAGNOSIS — H401130 Primary open-angle glaucoma, bilateral, stage unspecified: Secondary | ICD-10-CM | POA: Diagnosis not present

## 2016-10-25 DIAGNOSIS — L259 Unspecified contact dermatitis, unspecified cause: Secondary | ICD-10-CM | POA: Diagnosis not present

## 2016-12-05 DIAGNOSIS — R109 Unspecified abdominal pain: Secondary | ICD-10-CM | POA: Diagnosis not present

## 2016-12-05 DIAGNOSIS — E103493 Type 1 diabetes mellitus with severe nonproliferative diabetic retinopathy without macular edema, bilateral: Secondary | ICD-10-CM | POA: Diagnosis not present

## 2016-12-05 DIAGNOSIS — I10 Essential (primary) hypertension: Secondary | ICD-10-CM | POA: Diagnosis not present

## 2016-12-05 DIAGNOSIS — H401130 Primary open-angle glaucoma, bilateral, stage unspecified: Secondary | ICD-10-CM | POA: Diagnosis not present

## 2016-12-05 DIAGNOSIS — E1065 Type 1 diabetes mellitus with hyperglycemia: Secondary | ICD-10-CM | POA: Diagnosis not present

## 2016-12-18 DIAGNOSIS — Z89411 Acquired absence of right great toe: Secondary | ICD-10-CM | POA: Diagnosis not present

## 2016-12-18 DIAGNOSIS — I739 Peripheral vascular disease, unspecified: Secondary | ICD-10-CM | POA: Diagnosis not present

## 2016-12-18 DIAGNOSIS — E1051 Type 1 diabetes mellitus with diabetic peripheral angiopathy without gangrene: Secondary | ICD-10-CM | POA: Diagnosis not present

## 2017-01-21 DIAGNOSIS — E1065 Type 1 diabetes mellitus with hyperglycemia: Secondary | ICD-10-CM | POA: Diagnosis not present

## 2017-01-21 DIAGNOSIS — E1042 Type 1 diabetes mellitus with diabetic polyneuropathy: Secondary | ICD-10-CM | POA: Diagnosis not present

## 2017-01-21 DIAGNOSIS — I10 Essential (primary) hypertension: Secondary | ICD-10-CM | POA: Diagnosis not present

## 2017-01-21 DIAGNOSIS — E890 Postprocedural hypothyroidism: Secondary | ICD-10-CM | POA: Diagnosis not present

## 2017-01-21 DIAGNOSIS — I739 Peripheral vascular disease, unspecified: Secondary | ICD-10-CM | POA: Diagnosis not present

## 2017-03-06 DIAGNOSIS — E1065 Type 1 diabetes mellitus with hyperglycemia: Secondary | ICD-10-CM | POA: Diagnosis not present

## 2017-03-19 DIAGNOSIS — I739 Peripheral vascular disease, unspecified: Secondary | ICD-10-CM | POA: Diagnosis not present

## 2017-03-19 DIAGNOSIS — E1051 Type 1 diabetes mellitus with diabetic peripheral angiopathy without gangrene: Secondary | ICD-10-CM | POA: Diagnosis not present

## 2017-03-19 DIAGNOSIS — Z89411 Acquired absence of right great toe: Secondary | ICD-10-CM | POA: Diagnosis not present

## 2017-03-19 DIAGNOSIS — E1042 Type 1 diabetes mellitus with diabetic polyneuropathy: Secondary | ICD-10-CM | POA: Diagnosis not present

## 2017-03-19 DIAGNOSIS — E1065 Type 1 diabetes mellitus with hyperglycemia: Secondary | ICD-10-CM | POA: Diagnosis not present

## 2017-04-21 DIAGNOSIS — E890 Postprocedural hypothyroidism: Secondary | ICD-10-CM | POA: Diagnosis not present

## 2017-04-21 DIAGNOSIS — E1065 Type 1 diabetes mellitus with hyperglycemia: Secondary | ICD-10-CM | POA: Diagnosis not present

## 2017-04-21 DIAGNOSIS — I10 Essential (primary) hypertension: Secondary | ICD-10-CM | POA: Diagnosis not present

## 2017-04-21 DIAGNOSIS — Z794 Long term (current) use of insulin: Secondary | ICD-10-CM | POA: Diagnosis not present

## 2017-04-21 DIAGNOSIS — E1042 Type 1 diabetes mellitus with diabetic polyneuropathy: Secondary | ICD-10-CM | POA: Diagnosis not present

## 2017-05-29 DIAGNOSIS — E103493 Type 1 diabetes mellitus with severe nonproliferative diabetic retinopathy without macular edema, bilateral: Secondary | ICD-10-CM | POA: Diagnosis not present

## 2017-05-29 DIAGNOSIS — H401130 Primary open-angle glaucoma, bilateral, stage unspecified: Secondary | ICD-10-CM | POA: Diagnosis not present

## 2017-06-10 DIAGNOSIS — E1065 Type 1 diabetes mellitus with hyperglycemia: Secondary | ICD-10-CM | POA: Diagnosis not present

## 2017-06-10 DIAGNOSIS — E7800 Pure hypercholesterolemia, unspecified: Secondary | ICD-10-CM | POA: Diagnosis not present

## 2017-06-10 DIAGNOSIS — E039 Hypothyroidism, unspecified: Secondary | ICD-10-CM | POA: Diagnosis not present

## 2017-07-25 DIAGNOSIS — Z794 Long term (current) use of insulin: Secondary | ICD-10-CM | POA: Diagnosis not present

## 2017-07-25 DIAGNOSIS — Z9641 Presence of insulin pump (external) (internal): Secondary | ICD-10-CM | POA: Diagnosis not present

## 2017-07-25 DIAGNOSIS — I10 Essential (primary) hypertension: Secondary | ICD-10-CM | POA: Diagnosis not present

## 2017-07-25 DIAGNOSIS — E1065 Type 1 diabetes mellitus with hyperglycemia: Secondary | ICD-10-CM | POA: Diagnosis not present

## 2017-07-25 DIAGNOSIS — E890 Postprocedural hypothyroidism: Secondary | ICD-10-CM | POA: Diagnosis not present

## 2017-07-25 DIAGNOSIS — E1042 Type 1 diabetes mellitus with diabetic polyneuropathy: Secondary | ICD-10-CM | POA: Diagnosis not present

## 2017-07-28 DIAGNOSIS — I739 Peripheral vascular disease, unspecified: Secondary | ICD-10-CM | POA: Diagnosis not present

## 2017-07-28 DIAGNOSIS — E1051 Type 1 diabetes mellitus with diabetic peripheral angiopathy without gangrene: Secondary | ICD-10-CM | POA: Diagnosis not present

## 2017-07-28 DIAGNOSIS — Z89411 Acquired absence of right great toe: Secondary | ICD-10-CM | POA: Diagnosis not present

## 2017-08-06 DIAGNOSIS — I739 Peripheral vascular disease, unspecified: Secondary | ICD-10-CM | POA: Diagnosis not present

## 2017-08-06 DIAGNOSIS — I70203 Unspecified atherosclerosis of native arteries of extremities, bilateral legs: Secondary | ICD-10-CM | POA: Diagnosis not present

## 2017-08-06 DIAGNOSIS — E1065 Type 1 diabetes mellitus with hyperglycemia: Secondary | ICD-10-CM | POA: Diagnosis not present

## 2017-08-06 DIAGNOSIS — E1042 Type 1 diabetes mellitus with diabetic polyneuropathy: Secondary | ICD-10-CM | POA: Diagnosis not present

## 2017-08-08 DIAGNOSIS — R05 Cough: Secondary | ICD-10-CM | POA: Diagnosis not present

## 2017-08-08 DIAGNOSIS — S29011A Strain of muscle and tendon of front wall of thorax, initial encounter: Secondary | ICD-10-CM | POA: Diagnosis not present

## 2017-09-10 DIAGNOSIS — E10319 Type 1 diabetes mellitus with unspecified diabetic retinopathy without macular edema: Secondary | ICD-10-CM | POA: Diagnosis not present

## 2017-09-10 DIAGNOSIS — Z1389 Encounter for screening for other disorder: Secondary | ICD-10-CM | POA: Diagnosis not present

## 2017-09-10 DIAGNOSIS — E1065 Type 1 diabetes mellitus with hyperglycemia: Secondary | ICD-10-CM | POA: Diagnosis not present

## 2017-09-10 DIAGNOSIS — E1040 Type 1 diabetes mellitus with diabetic neuropathy, unspecified: Secondary | ICD-10-CM | POA: Diagnosis not present

## 2017-09-10 DIAGNOSIS — E039 Hypothyroidism, unspecified: Secondary | ICD-10-CM | POA: Diagnosis not present

## 2017-09-10 DIAGNOSIS — I10 Essential (primary) hypertension: Secondary | ICD-10-CM | POA: Diagnosis not present

## 2017-09-10 DIAGNOSIS — Z681 Body mass index (BMI) 19 or less, adult: Secondary | ICD-10-CM | POA: Diagnosis not present

## 2017-09-10 DIAGNOSIS — E7800 Pure hypercholesterolemia, unspecified: Secondary | ICD-10-CM | POA: Diagnosis not present

## 2017-09-10 DIAGNOSIS — Z125 Encounter for screening for malignant neoplasm of prostate: Secondary | ICD-10-CM | POA: Diagnosis not present

## 2017-09-10 DIAGNOSIS — J302 Other seasonal allergic rhinitis: Secondary | ICD-10-CM | POA: Diagnosis not present

## 2017-09-10 DIAGNOSIS — R809 Proteinuria, unspecified: Secondary | ICD-10-CM | POA: Diagnosis not present

## 2017-09-10 DIAGNOSIS — E1029 Type 1 diabetes mellitus with other diabetic kidney complication: Secondary | ICD-10-CM | POA: Diagnosis not present

## 2017-09-17 DIAGNOSIS — L255 Unspecified contact dermatitis due to plants, except food: Secondary | ICD-10-CM | POA: Diagnosis not present

## 2017-09-25 DIAGNOSIS — E1051 Type 1 diabetes mellitus with diabetic peripheral angiopathy without gangrene: Secondary | ICD-10-CM | POA: Diagnosis not present

## 2017-09-25 DIAGNOSIS — Z9641 Presence of insulin pump (external) (internal): Secondary | ICD-10-CM | POA: Diagnosis not present

## 2017-09-25 DIAGNOSIS — I10 Essential (primary) hypertension: Secondary | ICD-10-CM | POA: Diagnosis not present

## 2017-09-25 DIAGNOSIS — E1042 Type 1 diabetes mellitus with diabetic polyneuropathy: Secondary | ICD-10-CM | POA: Diagnosis not present

## 2017-09-25 DIAGNOSIS — E890 Postprocedural hypothyroidism: Secondary | ICD-10-CM | POA: Diagnosis not present

## 2017-09-25 DIAGNOSIS — E1065 Type 1 diabetes mellitus with hyperglycemia: Secondary | ICD-10-CM | POA: Diagnosis not present

## 2017-10-29 DIAGNOSIS — L97519 Non-pressure chronic ulcer of other part of right foot with unspecified severity: Secondary | ICD-10-CM | POA: Diagnosis not present

## 2017-10-29 DIAGNOSIS — L603 Nail dystrophy: Secondary | ICD-10-CM | POA: Diagnosis not present

## 2017-10-29 DIAGNOSIS — E10621 Type 1 diabetes mellitus with foot ulcer: Secondary | ICD-10-CM | POA: Diagnosis not present

## 2017-10-29 DIAGNOSIS — I739 Peripheral vascular disease, unspecified: Secondary | ICD-10-CM | POA: Diagnosis not present

## 2017-10-29 DIAGNOSIS — Z89411 Acquired absence of right great toe: Secondary | ICD-10-CM | POA: Diagnosis not present

## 2017-10-29 DIAGNOSIS — E1042 Type 1 diabetes mellitus with diabetic polyneuropathy: Secondary | ICD-10-CM | POA: Diagnosis not present

## 2017-10-29 DIAGNOSIS — E1065 Type 1 diabetes mellitus with hyperglycemia: Secondary | ICD-10-CM | POA: Diagnosis not present

## 2017-10-29 DIAGNOSIS — Z794 Long term (current) use of insulin: Secondary | ICD-10-CM | POA: Diagnosis not present

## 2017-11-07 DIAGNOSIS — E1042 Type 1 diabetes mellitus with diabetic polyneuropathy: Secondary | ICD-10-CM | POA: Diagnosis not present

## 2017-11-07 DIAGNOSIS — E1051 Type 1 diabetes mellitus with diabetic peripheral angiopathy without gangrene: Secondary | ICD-10-CM | POA: Diagnosis not present

## 2017-11-07 DIAGNOSIS — I70203 Unspecified atherosclerosis of native arteries of extremities, bilateral legs: Secondary | ICD-10-CM | POA: Diagnosis not present

## 2017-11-07 DIAGNOSIS — Z89411 Acquired absence of right great toe: Secondary | ICD-10-CM | POA: Diagnosis not present

## 2017-11-07 DIAGNOSIS — E1065 Type 1 diabetes mellitus with hyperglycemia: Secondary | ICD-10-CM | POA: Diagnosis not present

## 2017-11-07 DIAGNOSIS — E10621 Type 1 diabetes mellitus with foot ulcer: Secondary | ICD-10-CM | POA: Diagnosis not present

## 2017-11-07 DIAGNOSIS — L97519 Non-pressure chronic ulcer of other part of right foot with unspecified severity: Secondary | ICD-10-CM | POA: Diagnosis not present

## 2017-11-07 DIAGNOSIS — I739 Peripheral vascular disease, unspecified: Secondary | ICD-10-CM | POA: Diagnosis not present

## 2017-11-27 DIAGNOSIS — E103493 Type 1 diabetes mellitus with severe nonproliferative diabetic retinopathy without macular edema, bilateral: Secondary | ICD-10-CM | POA: Diagnosis not present

## 2017-11-27 DIAGNOSIS — H401130 Primary open-angle glaucoma, bilateral, stage unspecified: Secondary | ICD-10-CM | POA: Diagnosis not present

## 2017-12-19 DIAGNOSIS — E1065 Type 1 diabetes mellitus with hyperglycemia: Secondary | ICD-10-CM | POA: Diagnosis not present

## 2017-12-19 DIAGNOSIS — E7800 Pure hypercholesterolemia, unspecified: Secondary | ICD-10-CM | POA: Diagnosis not present

## 2017-12-19 DIAGNOSIS — E039 Hypothyroidism, unspecified: Secondary | ICD-10-CM | POA: Diagnosis not present

## 2018-01-28 DIAGNOSIS — E1042 Type 1 diabetes mellitus with diabetic polyneuropathy: Secondary | ICD-10-CM | POA: Diagnosis not present

## 2018-01-28 DIAGNOSIS — E1065 Type 1 diabetes mellitus with hyperglycemia: Secondary | ICD-10-CM | POA: Diagnosis not present

## 2018-01-28 DIAGNOSIS — I739 Peripheral vascular disease, unspecified: Secondary | ICD-10-CM | POA: Diagnosis not present

## 2018-01-28 DIAGNOSIS — Z89411 Acquired absence of right great toe: Secondary | ICD-10-CM | POA: Diagnosis not present

## 2018-02-12 DIAGNOSIS — E1042 Type 1 diabetes mellitus with diabetic polyneuropathy: Secondary | ICD-10-CM | POA: Diagnosis not present

## 2018-02-12 DIAGNOSIS — E1051 Type 1 diabetes mellitus with diabetic peripheral angiopathy without gangrene: Secondary | ICD-10-CM | POA: Diagnosis not present

## 2018-02-12 DIAGNOSIS — E890 Postprocedural hypothyroidism: Secondary | ICD-10-CM | POA: Diagnosis not present

## 2018-02-12 DIAGNOSIS — I10 Essential (primary) hypertension: Secondary | ICD-10-CM | POA: Diagnosis not present

## 2018-02-12 DIAGNOSIS — E1065 Type 1 diabetes mellitus with hyperglycemia: Secondary | ICD-10-CM | POA: Diagnosis not present

## 2018-02-20 DIAGNOSIS — E1042 Type 1 diabetes mellitus with diabetic polyneuropathy: Secondary | ICD-10-CM | POA: Diagnosis not present

## 2018-02-20 DIAGNOSIS — I10 Essential (primary) hypertension: Secondary | ICD-10-CM | POA: Diagnosis not present

## 2018-02-20 DIAGNOSIS — E10621 Type 1 diabetes mellitus with foot ulcer: Secondary | ICD-10-CM | POA: Diagnosis not present

## 2018-02-20 DIAGNOSIS — Z89411 Acquired absence of right great toe: Secondary | ICD-10-CM | POA: Diagnosis not present

## 2018-02-20 DIAGNOSIS — I739 Peripheral vascular disease, unspecified: Secondary | ICD-10-CM | POA: Diagnosis not present

## 2018-03-23 DIAGNOSIS — I10 Essential (primary) hypertension: Secondary | ICD-10-CM | POA: Diagnosis not present

## 2018-03-23 DIAGNOSIS — E1065 Type 1 diabetes mellitus with hyperglycemia: Secondary | ICD-10-CM | POA: Diagnosis not present

## 2018-03-23 DIAGNOSIS — E7800 Pure hypercholesterolemia, unspecified: Secondary | ICD-10-CM | POA: Diagnosis not present

## 2018-04-27 ENCOUNTER — Telehealth: Payer: Self-pay | Admitting: Student

## 2018-04-27 DIAGNOSIS — Z89411 Acquired absence of right great toe: Secondary | ICD-10-CM | POA: Diagnosis not present

## 2018-04-27 DIAGNOSIS — E1065 Type 1 diabetes mellitus with hyperglycemia: Secondary | ICD-10-CM | POA: Diagnosis not present

## 2018-04-27 DIAGNOSIS — E1042 Type 1 diabetes mellitus with diabetic polyneuropathy: Secondary | ICD-10-CM | POA: Diagnosis not present

## 2018-04-27 DIAGNOSIS — E1051 Type 1 diabetes mellitus with diabetic peripheral angiopathy without gangrene: Secondary | ICD-10-CM | POA: Diagnosis not present

## 2018-04-27 NOTE — Telephone Encounter (Signed)
Faxed prescription request to [facility] in [place], [place] [phone]) at [phone]- Plavix 75 mg tablets, take one tablet by mouth once daily, dispense 90 tablets with 3 refills.  Waylan Boga Kaladin Noseworthy, PA-C 04/27/2018, 9:08 AM

## 2018-05-19 DIAGNOSIS — I10 Essential (primary) hypertension: Secondary | ICD-10-CM | POA: Diagnosis not present

## 2018-05-19 DIAGNOSIS — E1065 Type 1 diabetes mellitus with hyperglycemia: Secondary | ICD-10-CM | POA: Diagnosis not present

## 2018-05-19 DIAGNOSIS — E890 Postprocedural hypothyroidism: Secondary | ICD-10-CM | POA: Diagnosis not present

## 2018-05-19 DIAGNOSIS — E1051 Type 1 diabetes mellitus with diabetic peripheral angiopathy without gangrene: Secondary | ICD-10-CM | POA: Diagnosis not present

## 2018-05-19 DIAGNOSIS — E1042 Type 1 diabetes mellitus with diabetic polyneuropathy: Secondary | ICD-10-CM | POA: Diagnosis not present

## 2018-05-26 DIAGNOSIS — H401130 Primary open-angle glaucoma, bilateral, stage unspecified: Secondary | ICD-10-CM | POA: Diagnosis not present

## 2018-06-22 DIAGNOSIS — E109 Type 1 diabetes mellitus without complications: Secondary | ICD-10-CM | POA: Diagnosis not present

## 2018-06-22 DIAGNOSIS — Z794 Long term (current) use of insulin: Secondary | ICD-10-CM | POA: Diagnosis not present

## 2018-06-23 DIAGNOSIS — I10 Essential (primary) hypertension: Secondary | ICD-10-CM | POA: Diagnosis not present

## 2018-06-23 DIAGNOSIS — Z794 Long term (current) use of insulin: Secondary | ICD-10-CM | POA: Diagnosis not present

## 2018-06-23 DIAGNOSIS — E1051 Type 1 diabetes mellitus with diabetic peripheral angiopathy without gangrene: Secondary | ICD-10-CM | POA: Diagnosis not present

## 2018-06-23 DIAGNOSIS — E7800 Pure hypercholesterolemia, unspecified: Secondary | ICD-10-CM | POA: Diagnosis not present

## 2018-06-23 DIAGNOSIS — E1065 Type 1 diabetes mellitus with hyperglycemia: Secondary | ICD-10-CM | POA: Diagnosis not present

## 2018-06-23 DIAGNOSIS — E1042 Type 1 diabetes mellitus with diabetic polyneuropathy: Secondary | ICD-10-CM | POA: Diagnosis not present

## 2018-06-30 DIAGNOSIS — E1042 Type 1 diabetes mellitus with diabetic polyneuropathy: Secondary | ICD-10-CM | POA: Diagnosis not present

## 2018-06-30 DIAGNOSIS — I10 Essential (primary) hypertension: Secondary | ICD-10-CM | POA: Diagnosis not present

## 2018-06-30 DIAGNOSIS — E890 Postprocedural hypothyroidism: Secondary | ICD-10-CM | POA: Diagnosis not present

## 2018-06-30 DIAGNOSIS — Z794 Long term (current) use of insulin: Secondary | ICD-10-CM | POA: Diagnosis not present

## 2018-07-27 DIAGNOSIS — Z89411 Acquired absence of right great toe: Secondary | ICD-10-CM | POA: Diagnosis not present

## 2018-07-27 DIAGNOSIS — E1065 Type 1 diabetes mellitus with hyperglycemia: Secondary | ICD-10-CM | POA: Diagnosis not present

## 2018-07-27 DIAGNOSIS — I70203 Unspecified atherosclerosis of native arteries of extremities, bilateral legs: Secondary | ICD-10-CM | POA: Diagnosis not present

## 2018-07-27 DIAGNOSIS — E1042 Type 1 diabetes mellitus with diabetic polyneuropathy: Secondary | ICD-10-CM | POA: Diagnosis not present

## 2018-07-30 DIAGNOSIS — E1065 Type 1 diabetes mellitus with hyperglycemia: Secondary | ICD-10-CM | POA: Diagnosis not present

## 2018-07-30 DIAGNOSIS — E1051 Type 1 diabetes mellitus with diabetic peripheral angiopathy without gangrene: Secondary | ICD-10-CM | POA: Diagnosis not present

## 2018-07-30 DIAGNOSIS — E1042 Type 1 diabetes mellitus with diabetic polyneuropathy: Secondary | ICD-10-CM | POA: Diagnosis not present

## 2018-07-30 DIAGNOSIS — Z794 Long term (current) use of insulin: Secondary | ICD-10-CM | POA: Diagnosis not present

## 2018-08-19 DIAGNOSIS — Z794 Long term (current) use of insulin: Secondary | ICD-10-CM | POA: Diagnosis not present

## 2018-08-19 DIAGNOSIS — E1065 Type 1 diabetes mellitus with hyperglycemia: Secondary | ICD-10-CM | POA: Diagnosis not present

## 2018-08-21 DIAGNOSIS — E1051 Type 1 diabetes mellitus with diabetic peripheral angiopathy without gangrene: Secondary | ICD-10-CM | POA: Diagnosis not present

## 2018-08-21 DIAGNOSIS — E782 Mixed hyperlipidemia: Secondary | ICD-10-CM | POA: Diagnosis not present

## 2018-08-21 DIAGNOSIS — E1042 Type 1 diabetes mellitus with diabetic polyneuropathy: Secondary | ICD-10-CM | POA: Diagnosis not present

## 2018-08-21 DIAGNOSIS — E890 Postprocedural hypothyroidism: Secondary | ICD-10-CM | POA: Diagnosis not present

## 2018-08-21 DIAGNOSIS — I10 Essential (primary) hypertension: Secondary | ICD-10-CM | POA: Diagnosis not present

## 2018-08-27 DIAGNOSIS — E782 Mixed hyperlipidemia: Secondary | ICD-10-CM | POA: Diagnosis not present

## 2018-08-27 DIAGNOSIS — I739 Peripheral vascular disease, unspecified: Secondary | ICD-10-CM | POA: Diagnosis not present

## 2018-08-27 DIAGNOSIS — Z89411 Acquired absence of right great toe: Secondary | ICD-10-CM | POA: Diagnosis not present

## 2018-08-27 DIAGNOSIS — I10 Essential (primary) hypertension: Secondary | ICD-10-CM | POA: Diagnosis not present

## 2018-08-27 DIAGNOSIS — E1042 Type 1 diabetes mellitus with diabetic polyneuropathy: Secondary | ICD-10-CM | POA: Diagnosis not present

## 2018-08-31 DIAGNOSIS — E1065 Type 1 diabetes mellitus with hyperglycemia: Secondary | ICD-10-CM | POA: Diagnosis not present

## 2018-08-31 DIAGNOSIS — E1042 Type 1 diabetes mellitus with diabetic polyneuropathy: Secondary | ICD-10-CM | POA: Diagnosis not present

## 2018-09-16 DIAGNOSIS — E1051 Type 1 diabetes mellitus with diabetic peripheral angiopathy without gangrene: Secondary | ICD-10-CM | POA: Diagnosis not present

## 2018-09-18 DIAGNOSIS — Z794 Long term (current) use of insulin: Secondary | ICD-10-CM | POA: Diagnosis not present

## 2018-09-18 DIAGNOSIS — E1065 Type 1 diabetes mellitus with hyperglycemia: Secondary | ICD-10-CM | POA: Diagnosis not present

## 2018-09-21 DIAGNOSIS — Z794 Long term (current) use of insulin: Secondary | ICD-10-CM | POA: Diagnosis not present

## 2018-09-21 DIAGNOSIS — E1042 Type 1 diabetes mellitus with diabetic polyneuropathy: Secondary | ICD-10-CM | POA: Diagnosis not present

## 2018-09-21 DIAGNOSIS — E1065 Type 1 diabetes mellitus with hyperglycemia: Secondary | ICD-10-CM | POA: Diagnosis not present

## 2018-09-21 DIAGNOSIS — E1051 Type 1 diabetes mellitus with diabetic peripheral angiopathy without gangrene: Secondary | ICD-10-CM | POA: Diagnosis not present

## 2018-09-28 DIAGNOSIS — E1065 Type 1 diabetes mellitus with hyperglycemia: Secondary | ICD-10-CM | POA: Diagnosis not present

## 2018-09-28 DIAGNOSIS — E1140 Type 2 diabetes mellitus with diabetic neuropathy, unspecified: Secondary | ICD-10-CM | POA: Diagnosis not present

## 2018-10-19 DIAGNOSIS — E1065 Type 1 diabetes mellitus with hyperglycemia: Secondary | ICD-10-CM | POA: Diagnosis not present

## 2018-10-19 DIAGNOSIS — Z794 Long term (current) use of insulin: Secondary | ICD-10-CM | POA: Diagnosis not present

## 2018-10-21 DIAGNOSIS — Z794 Long term (current) use of insulin: Secondary | ICD-10-CM | POA: Diagnosis not present

## 2018-10-21 DIAGNOSIS — E1051 Type 1 diabetes mellitus with diabetic peripheral angiopathy without gangrene: Secondary | ICD-10-CM | POA: Diagnosis not present

## 2018-10-21 DIAGNOSIS — E1042 Type 1 diabetes mellitus with diabetic polyneuropathy: Secondary | ICD-10-CM | POA: Diagnosis not present

## 2018-10-21 DIAGNOSIS — E1065 Type 1 diabetes mellitus with hyperglycemia: Secondary | ICD-10-CM | POA: Diagnosis not present

## 2018-11-17 DIAGNOSIS — E1051 Type 1 diabetes mellitus with diabetic peripheral angiopathy without gangrene: Secondary | ICD-10-CM | POA: Diagnosis not present

## 2018-11-17 DIAGNOSIS — Z794 Long term (current) use of insulin: Secondary | ICD-10-CM | POA: Diagnosis not present

## 2018-11-17 DIAGNOSIS — E1065 Type 1 diabetes mellitus with hyperglycemia: Secondary | ICD-10-CM | POA: Diagnosis not present

## 2018-11-17 DIAGNOSIS — E1042 Type 1 diabetes mellitus with diabetic polyneuropathy: Secondary | ICD-10-CM | POA: Diagnosis not present

## 2018-11-30 DIAGNOSIS — H59812 Chorioretinal scars after surgery for detachment, left eye: Secondary | ICD-10-CM | POA: Diagnosis not present

## 2018-11-30 DIAGNOSIS — E109 Type 1 diabetes mellitus without complications: Secondary | ICD-10-CM | POA: Diagnosis not present

## 2018-11-30 DIAGNOSIS — H401132 Primary open-angle glaucoma, bilateral, moderate stage: Secondary | ICD-10-CM | POA: Diagnosis not present

## 2018-12-04 DIAGNOSIS — H5213 Myopia, bilateral: Secondary | ICD-10-CM | POA: Diagnosis not present

## 2018-12-18 DIAGNOSIS — Z794 Long term (current) use of insulin: Secondary | ICD-10-CM | POA: Diagnosis not present

## 2018-12-18 DIAGNOSIS — E1065 Type 1 diabetes mellitus with hyperglycemia: Secondary | ICD-10-CM | POA: Diagnosis not present

## 2018-12-21 DIAGNOSIS — E1051 Type 1 diabetes mellitus with diabetic peripheral angiopathy without gangrene: Secondary | ICD-10-CM | POA: Diagnosis not present

## 2018-12-21 DIAGNOSIS — E890 Postprocedural hypothyroidism: Secondary | ICD-10-CM | POA: Diagnosis not present

## 2018-12-21 DIAGNOSIS — Z794 Long term (current) use of insulin: Secondary | ICD-10-CM | POA: Diagnosis not present

## 2018-12-21 DIAGNOSIS — E1042 Type 1 diabetes mellitus with diabetic polyneuropathy: Secondary | ICD-10-CM | POA: Diagnosis not present

## 2018-12-21 DIAGNOSIS — E1065 Type 1 diabetes mellitus with hyperglycemia: Secondary | ICD-10-CM | POA: Diagnosis not present

## 2018-12-21 DIAGNOSIS — E782 Mixed hyperlipidemia: Secondary | ICD-10-CM | POA: Diagnosis not present

## 2019-01-06 DIAGNOSIS — Z1389 Encounter for screening for other disorder: Secondary | ICD-10-CM | POA: Diagnosis not present

## 2019-01-06 DIAGNOSIS — R809 Proteinuria, unspecified: Secondary | ICD-10-CM | POA: Diagnosis not present

## 2019-01-06 DIAGNOSIS — E1065 Type 1 diabetes mellitus with hyperglycemia: Secondary | ICD-10-CM | POA: Diagnosis not present

## 2019-01-06 DIAGNOSIS — E0590 Thyrotoxicosis, unspecified without thyrotoxic crisis or storm: Secondary | ICD-10-CM | POA: Diagnosis not present

## 2019-01-06 DIAGNOSIS — E10319 Type 1 diabetes mellitus with unspecified diabetic retinopathy without macular edema: Secondary | ICD-10-CM | POA: Diagnosis not present

## 2019-01-06 DIAGNOSIS — E7800 Pure hypercholesterolemia, unspecified: Secondary | ICD-10-CM | POA: Diagnosis not present

## 2019-01-06 DIAGNOSIS — I739 Peripheral vascular disease, unspecified: Secondary | ICD-10-CM | POA: Diagnosis not present

## 2019-01-06 DIAGNOSIS — Z0000 Encounter for general adult medical examination without abnormal findings: Secondary | ICD-10-CM | POA: Diagnosis not present

## 2019-01-06 DIAGNOSIS — I10 Essential (primary) hypertension: Secondary | ICD-10-CM | POA: Diagnosis not present

## 2019-01-27 DIAGNOSIS — Z89411 Acquired absence of right great toe: Secondary | ICD-10-CM | POA: Diagnosis not present

## 2019-01-27 DIAGNOSIS — E109 Type 1 diabetes mellitus without complications: Secondary | ICD-10-CM | POA: Diagnosis not present

## 2019-01-27 DIAGNOSIS — E1051 Type 1 diabetes mellitus with diabetic peripheral angiopathy without gangrene: Secondary | ICD-10-CM | POA: Diagnosis not present

## 2019-03-01 DIAGNOSIS — E1065 Type 1 diabetes mellitus with hyperglycemia: Secondary | ICD-10-CM | POA: Diagnosis not present

## 2019-03-01 DIAGNOSIS — E1042 Type 1 diabetes mellitus with diabetic polyneuropathy: Secondary | ICD-10-CM | POA: Diagnosis not present

## 2019-03-02 DIAGNOSIS — E1042 Type 1 diabetes mellitus with diabetic polyneuropathy: Secondary | ICD-10-CM | POA: Diagnosis not present

## 2019-03-02 DIAGNOSIS — E890 Postprocedural hypothyroidism: Secondary | ICD-10-CM | POA: Diagnosis not present

## 2019-03-02 DIAGNOSIS — Z89411 Acquired absence of right great toe: Secondary | ICD-10-CM | POA: Diagnosis not present

## 2019-03-02 DIAGNOSIS — I10 Essential (primary) hypertension: Secondary | ICD-10-CM | POA: Diagnosis not present

## 2019-03-02 DIAGNOSIS — E1051 Type 1 diabetes mellitus with diabetic peripheral angiopathy without gangrene: Secondary | ICD-10-CM | POA: Diagnosis not present

## 2019-03-02 DIAGNOSIS — Z794 Long term (current) use of insulin: Secondary | ICD-10-CM | POA: Diagnosis not present

## 2019-03-04 DIAGNOSIS — E1065 Type 1 diabetes mellitus with hyperglycemia: Secondary | ICD-10-CM | POA: Diagnosis not present

## 2019-03-04 DIAGNOSIS — E1042 Type 1 diabetes mellitus with diabetic polyneuropathy: Secondary | ICD-10-CM | POA: Diagnosis not present

## 2019-03-19 DIAGNOSIS — E1065 Type 1 diabetes mellitus with hyperglycemia: Secondary | ICD-10-CM | POA: Diagnosis not present

## 2019-03-19 DIAGNOSIS — E1051 Type 1 diabetes mellitus with diabetic peripheral angiopathy without gangrene: Secondary | ICD-10-CM | POA: Diagnosis not present

## 2019-03-19 DIAGNOSIS — E1042 Type 1 diabetes mellitus with diabetic polyneuropathy: Secondary | ICD-10-CM | POA: Diagnosis not present

## 2019-04-02 DIAGNOSIS — E1051 Type 1 diabetes mellitus with diabetic peripheral angiopathy without gangrene: Secondary | ICD-10-CM | POA: Diagnosis not present

## 2019-04-16 DIAGNOSIS — E1051 Type 1 diabetes mellitus with diabetic peripheral angiopathy without gangrene: Secondary | ICD-10-CM | POA: Diagnosis not present

## 2019-04-26 DIAGNOSIS — R339 Retention of urine, unspecified: Secondary | ICD-10-CM | POA: Diagnosis not present

## 2019-04-26 DIAGNOSIS — E1065 Type 1 diabetes mellitus with hyperglycemia: Secondary | ICD-10-CM | POA: Diagnosis not present

## 2019-04-26 DIAGNOSIS — I10 Essential (primary) hypertension: Secondary | ICD-10-CM | POA: Diagnosis not present

## 2019-04-26 DIAGNOSIS — E039 Hypothyroidism, unspecified: Secondary | ICD-10-CM | POA: Diagnosis not present

## 2019-05-03 DIAGNOSIS — Z794 Long term (current) use of insulin: Secondary | ICD-10-CM | POA: Diagnosis not present

## 2019-05-03 DIAGNOSIS — E890 Postprocedural hypothyroidism: Secondary | ICD-10-CM | POA: Diagnosis not present

## 2019-05-03 DIAGNOSIS — I10 Essential (primary) hypertension: Secondary | ICD-10-CM | POA: Diagnosis not present

## 2019-05-03 DIAGNOSIS — E109 Type 1 diabetes mellitus without complications: Secondary | ICD-10-CM | POA: Diagnosis not present

## 2019-05-20 DIAGNOSIS — E1065 Type 1 diabetes mellitus with hyperglycemia: Secondary | ICD-10-CM | POA: Diagnosis not present

## 2019-05-20 DIAGNOSIS — E1042 Type 1 diabetes mellitus with diabetic polyneuropathy: Secondary | ICD-10-CM | POA: Diagnosis not present

## 2019-05-21 DIAGNOSIS — E1065 Type 1 diabetes mellitus with hyperglycemia: Secondary | ICD-10-CM | POA: Diagnosis not present

## 2019-06-04 DIAGNOSIS — E1065 Type 1 diabetes mellitus with hyperglycemia: Secondary | ICD-10-CM | POA: Diagnosis not present

## 2019-06-04 DIAGNOSIS — E1042 Type 1 diabetes mellitus with diabetic polyneuropathy: Secondary | ICD-10-CM | POA: Diagnosis not present

## 2019-07-02 DIAGNOSIS — E1051 Type 1 diabetes mellitus with diabetic peripheral angiopathy without gangrene: Secondary | ICD-10-CM | POA: Diagnosis not present

## 2019-07-26 DIAGNOSIS — I10 Essential (primary) hypertension: Secondary | ICD-10-CM | POA: Diagnosis not present

## 2019-07-26 DIAGNOSIS — E1165 Type 2 diabetes mellitus with hyperglycemia: Secondary | ICD-10-CM | POA: Diagnosis not present

## 2019-07-26 DIAGNOSIS — E039 Hypothyroidism, unspecified: Secondary | ICD-10-CM | POA: Diagnosis not present

## 2019-07-28 DIAGNOSIS — E109 Type 1 diabetes mellitus without complications: Secondary | ICD-10-CM | POA: Diagnosis not present

## 2019-07-28 DIAGNOSIS — Z89411 Acquired absence of right great toe: Secondary | ICD-10-CM | POA: Diagnosis not present

## 2019-07-28 DIAGNOSIS — E1051 Type 1 diabetes mellitus with diabetic peripheral angiopathy without gangrene: Secondary | ICD-10-CM | POA: Diagnosis not present

## 2019-08-05 DIAGNOSIS — E890 Postprocedural hypothyroidism: Secondary | ICD-10-CM | POA: Diagnosis not present

## 2019-08-05 DIAGNOSIS — E1065 Type 1 diabetes mellitus with hyperglycemia: Secondary | ICD-10-CM | POA: Diagnosis not present

## 2019-08-05 DIAGNOSIS — E782 Mixed hyperlipidemia: Secondary | ICD-10-CM | POA: Diagnosis not present

## 2019-08-05 DIAGNOSIS — E1042 Type 1 diabetes mellitus with diabetic polyneuropathy: Secondary | ICD-10-CM | POA: Diagnosis not present

## 2019-08-05 DIAGNOSIS — I10 Essential (primary) hypertension: Secondary | ICD-10-CM | POA: Diagnosis not present

## 2019-08-09 DIAGNOSIS — E1065 Type 1 diabetes mellitus with hyperglycemia: Secondary | ICD-10-CM | POA: Diagnosis not present

## 2019-08-09 DIAGNOSIS — E1042 Type 1 diabetes mellitus with diabetic polyneuropathy: Secondary | ICD-10-CM | POA: Diagnosis not present

## 2019-08-30 DIAGNOSIS — E1051 Type 1 diabetes mellitus with diabetic peripheral angiopathy without gangrene: Secondary | ICD-10-CM | POA: Diagnosis not present

## 2019-08-30 DIAGNOSIS — E782 Mixed hyperlipidemia: Secondary | ICD-10-CM | POA: Diagnosis not present

## 2019-08-30 DIAGNOSIS — Z89411 Acquired absence of right great toe: Secondary | ICD-10-CM | POA: Diagnosis not present

## 2019-08-30 DIAGNOSIS — I739 Peripheral vascular disease, unspecified: Secondary | ICD-10-CM | POA: Diagnosis not present

## 2019-08-30 DIAGNOSIS — I10 Essential (primary) hypertension: Secondary | ICD-10-CM | POA: Diagnosis not present

## 2019-09-28 DIAGNOSIS — Z1211 Encounter for screening for malignant neoplasm of colon: Secondary | ICD-10-CM | POA: Diagnosis not present

## 2019-10-27 DIAGNOSIS — E1065 Type 1 diabetes mellitus with hyperglycemia: Secondary | ICD-10-CM | POA: Diagnosis not present

## 2019-11-08 DIAGNOSIS — E890 Postprocedural hypothyroidism: Secondary | ICD-10-CM | POA: Diagnosis not present

## 2019-11-08 DIAGNOSIS — E782 Mixed hyperlipidemia: Secondary | ICD-10-CM | POA: Diagnosis not present

## 2019-11-08 DIAGNOSIS — E1051 Type 1 diabetes mellitus with diabetic peripheral angiopathy without gangrene: Secondary | ICD-10-CM | POA: Diagnosis not present

## 2019-11-19 DIAGNOSIS — E1042 Type 1 diabetes mellitus with diabetic polyneuropathy: Secondary | ICD-10-CM | POA: Diagnosis not present

## 2019-11-19 DIAGNOSIS — E1065 Type 1 diabetes mellitus with hyperglycemia: Secondary | ICD-10-CM | POA: Diagnosis not present

## 2019-12-21 DIAGNOSIS — H59812 Chorioretinal scars after surgery for detachment, left eye: Secondary | ICD-10-CM | POA: Diagnosis not present

## 2019-12-21 DIAGNOSIS — H401132 Primary open-angle glaucoma, bilateral, moderate stage: Secondary | ICD-10-CM | POA: Diagnosis not present

## 2019-12-21 DIAGNOSIS — E109 Type 1 diabetes mellitus without complications: Secondary | ICD-10-CM | POA: Diagnosis not present

## 2020-01-26 DIAGNOSIS — E109 Type 1 diabetes mellitus without complications: Secondary | ICD-10-CM | POA: Diagnosis not present

## 2020-01-26 DIAGNOSIS — E1051 Type 1 diabetes mellitus with diabetic peripheral angiopathy without gangrene: Secondary | ICD-10-CM | POA: Diagnosis not present

## 2020-01-26 DIAGNOSIS — Z89411 Acquired absence of right great toe: Secondary | ICD-10-CM | POA: Diagnosis not present

## 2020-02-07 DIAGNOSIS — E1042 Type 1 diabetes mellitus with diabetic polyneuropathy: Secondary | ICD-10-CM | POA: Diagnosis not present

## 2020-02-07 DIAGNOSIS — E1065 Type 1 diabetes mellitus with hyperglycemia: Secondary | ICD-10-CM | POA: Diagnosis not present

## 2020-02-08 DIAGNOSIS — E1042 Type 1 diabetes mellitus with diabetic polyneuropathy: Secondary | ICD-10-CM | POA: Diagnosis not present

## 2020-02-08 DIAGNOSIS — E890 Postprocedural hypothyroidism: Secondary | ICD-10-CM | POA: Diagnosis not present

## 2020-02-08 DIAGNOSIS — Z89411 Acquired absence of right great toe: Secondary | ICD-10-CM | POA: Diagnosis not present

## 2020-02-08 DIAGNOSIS — E1065 Type 1 diabetes mellitus with hyperglycemia: Secondary | ICD-10-CM | POA: Diagnosis not present

## 2020-02-08 DIAGNOSIS — E782 Mixed hyperlipidemia: Secondary | ICD-10-CM | POA: Diagnosis not present

## 2020-02-08 DIAGNOSIS — I10 Essential (primary) hypertension: Secondary | ICD-10-CM | POA: Diagnosis not present

## 2020-02-08 DIAGNOSIS — E1051 Type 1 diabetes mellitus with diabetic peripheral angiopathy without gangrene: Secondary | ICD-10-CM | POA: Diagnosis not present

## 2020-02-21 DIAGNOSIS — E7800 Pure hypercholesterolemia, unspecified: Secondary | ICD-10-CM | POA: Diagnosis not present

## 2020-02-21 DIAGNOSIS — E1065 Type 1 diabetes mellitus with hyperglycemia: Secondary | ICD-10-CM | POA: Diagnosis not present

## 2020-02-21 DIAGNOSIS — E039 Hypothyroidism, unspecified: Secondary | ICD-10-CM | POA: Diagnosis not present

## 2020-02-23 DIAGNOSIS — E7800 Pure hypercholesterolemia, unspecified: Secondary | ICD-10-CM | POA: Diagnosis not present

## 2020-02-23 DIAGNOSIS — E039 Hypothyroidism, unspecified: Secondary | ICD-10-CM | POA: Diagnosis not present

## 2020-02-23 DIAGNOSIS — E1065 Type 1 diabetes mellitus with hyperglycemia: Secondary | ICD-10-CM | POA: Diagnosis not present

## 2020-05-06 DIAGNOSIS — E1042 Type 1 diabetes mellitus with diabetic polyneuropathy: Secondary | ICD-10-CM | POA: Diagnosis not present

## 2020-05-06 DIAGNOSIS — E1065 Type 1 diabetes mellitus with hyperglycemia: Secondary | ICD-10-CM | POA: Diagnosis not present

## 2020-05-09 DIAGNOSIS — E1042 Type 1 diabetes mellitus with diabetic polyneuropathy: Secondary | ICD-10-CM | POA: Diagnosis not present

## 2020-05-09 DIAGNOSIS — E782 Mixed hyperlipidemia: Secondary | ICD-10-CM | POA: Diagnosis not present

## 2020-05-09 DIAGNOSIS — E890 Postprocedural hypothyroidism: Secondary | ICD-10-CM | POA: Diagnosis not present

## 2020-05-24 DIAGNOSIS — Z1339 Encounter for screening examination for other mental health and behavioral disorders: Secondary | ICD-10-CM | POA: Diagnosis not present

## 2020-05-24 DIAGNOSIS — I10 Essential (primary) hypertension: Secondary | ICD-10-CM | POA: Diagnosis not present

## 2020-05-24 DIAGNOSIS — E1029 Type 1 diabetes mellitus with other diabetic kidney complication: Secondary | ICD-10-CM | POA: Diagnosis not present

## 2020-05-24 DIAGNOSIS — E1040 Type 1 diabetes mellitus with diabetic neuropathy, unspecified: Secondary | ICD-10-CM | POA: Diagnosis not present

## 2020-05-24 DIAGNOSIS — Z1331 Encounter for screening for depression: Secondary | ICD-10-CM | POA: Diagnosis not present

## 2020-05-24 DIAGNOSIS — Z0000 Encounter for general adult medical examination without abnormal findings: Secondary | ICD-10-CM | POA: Diagnosis not present

## 2020-05-24 DIAGNOSIS — E7800 Pure hypercholesterolemia, unspecified: Secondary | ICD-10-CM | POA: Diagnosis not present

## 2020-05-24 DIAGNOSIS — Z125 Encounter for screening for malignant neoplasm of prostate: Secondary | ICD-10-CM | POA: Diagnosis not present

## 2020-05-24 DIAGNOSIS — E039 Hypothyroidism, unspecified: Secondary | ICD-10-CM | POA: Diagnosis not present

## 2020-05-24 DIAGNOSIS — I739 Peripheral vascular disease, unspecified: Secondary | ICD-10-CM | POA: Diagnosis not present

## 2020-05-24 DIAGNOSIS — Z6820 Body mass index (BMI) 20.0-20.9, adult: Secondary | ICD-10-CM | POA: Diagnosis not present

## 2020-05-24 DIAGNOSIS — E10319 Type 1 diabetes mellitus with unspecified diabetic retinopathy without macular edema: Secondary | ICD-10-CM | POA: Diagnosis not present

## 2020-05-29 DIAGNOSIS — Z1211 Encounter for screening for malignant neoplasm of colon: Secondary | ICD-10-CM | POA: Diagnosis not present

## 2020-05-29 DIAGNOSIS — Z1212 Encounter for screening for malignant neoplasm of rectum: Secondary | ICD-10-CM | POA: Diagnosis not present

## 2020-06-07 LAB — COLOGUARD: COLOGUARD: NEGATIVE

## 2020-07-26 DIAGNOSIS — E1051 Type 1 diabetes mellitus with diabetic peripheral angiopathy without gangrene: Secondary | ICD-10-CM | POA: Diagnosis not present

## 2020-07-26 DIAGNOSIS — I739 Peripheral vascular disease, unspecified: Secondary | ICD-10-CM | POA: Diagnosis not present

## 2020-07-26 DIAGNOSIS — Z89411 Acquired absence of right great toe: Secondary | ICD-10-CM | POA: Diagnosis not present

## 2020-07-26 DIAGNOSIS — E109 Type 1 diabetes mellitus without complications: Secondary | ICD-10-CM | POA: Diagnosis not present

## 2020-07-27 DIAGNOSIS — H401132 Primary open-angle glaucoma, bilateral, moderate stage: Secondary | ICD-10-CM | POA: Diagnosis not present

## 2020-07-27 DIAGNOSIS — H04123 Dry eye syndrome of bilateral lacrimal glands: Secondary | ICD-10-CM | POA: Diagnosis not present

## 2020-07-27 DIAGNOSIS — E1042 Type 1 diabetes mellitus with diabetic polyneuropathy: Secondary | ICD-10-CM | POA: Diagnosis not present

## 2020-07-27 DIAGNOSIS — H59812 Chorioretinal scars after surgery for detachment, left eye: Secondary | ICD-10-CM | POA: Diagnosis not present

## 2020-07-27 DIAGNOSIS — E109 Type 1 diabetes mellitus without complications: Secondary | ICD-10-CM | POA: Diagnosis not present

## 2020-07-27 DIAGNOSIS — E1065 Type 1 diabetes mellitus with hyperglycemia: Secondary | ICD-10-CM | POA: Diagnosis not present

## 2020-07-27 DIAGNOSIS — H52223 Regular astigmatism, bilateral: Secondary | ICD-10-CM | POA: Diagnosis not present

## 2020-08-01 DIAGNOSIS — E1065 Type 1 diabetes mellitus with hyperglycemia: Secondary | ICD-10-CM | POA: Diagnosis not present

## 2020-08-09 DIAGNOSIS — I10 Essential (primary) hypertension: Secondary | ICD-10-CM | POA: Diagnosis not present

## 2020-08-09 DIAGNOSIS — E890 Postprocedural hypothyroidism: Secondary | ICD-10-CM | POA: Diagnosis not present

## 2020-08-09 DIAGNOSIS — E1042 Type 1 diabetes mellitus with diabetic polyneuropathy: Secondary | ICD-10-CM | POA: Diagnosis not present

## 2020-08-09 DIAGNOSIS — E782 Mixed hyperlipidemia: Secondary | ICD-10-CM | POA: Diagnosis not present

## 2020-08-25 DIAGNOSIS — E1021 Type 1 diabetes mellitus with diabetic nephropathy: Secondary | ICD-10-CM | POA: Diagnosis not present

## 2020-08-25 DIAGNOSIS — N182 Chronic kidney disease, stage 2 (mild): Secondary | ICD-10-CM | POA: Diagnosis not present

## 2020-08-25 DIAGNOSIS — E039 Hypothyroidism, unspecified: Secondary | ICD-10-CM | POA: Diagnosis not present

## 2020-08-25 DIAGNOSIS — E1040 Type 1 diabetes mellitus with diabetic neuropathy, unspecified: Secondary | ICD-10-CM | POA: Diagnosis not present

## 2020-08-25 DIAGNOSIS — E10319 Type 1 diabetes mellitus with unspecified diabetic retinopathy without macular edema: Secondary | ICD-10-CM | POA: Diagnosis not present

## 2020-08-25 DIAGNOSIS — I10 Essential (primary) hypertension: Secondary | ICD-10-CM | POA: Diagnosis not present

## 2020-10-20 DIAGNOSIS — E1065 Type 1 diabetes mellitus with hyperglycemia: Secondary | ICD-10-CM | POA: Diagnosis not present

## 2020-10-20 DIAGNOSIS — E1042 Type 1 diabetes mellitus with diabetic polyneuropathy: Secondary | ICD-10-CM | POA: Diagnosis not present

## 2020-11-09 DIAGNOSIS — E782 Mixed hyperlipidemia: Secondary | ICD-10-CM | POA: Diagnosis not present

## 2020-11-09 DIAGNOSIS — I10 Essential (primary) hypertension: Secondary | ICD-10-CM | POA: Diagnosis not present

## 2020-11-09 DIAGNOSIS — Z87891 Personal history of nicotine dependence: Secondary | ICD-10-CM | POA: Diagnosis not present

## 2020-11-09 DIAGNOSIS — E890 Postprocedural hypothyroidism: Secondary | ICD-10-CM | POA: Diagnosis not present

## 2020-11-09 DIAGNOSIS — E1042 Type 1 diabetes mellitus with diabetic polyneuropathy: Secondary | ICD-10-CM | POA: Diagnosis not present

## 2020-11-09 DIAGNOSIS — Z9641 Presence of insulin pump (external) (internal): Secondary | ICD-10-CM | POA: Diagnosis not present

## 2020-11-09 DIAGNOSIS — Z79899 Other long term (current) drug therapy: Secondary | ICD-10-CM | POA: Diagnosis not present

## 2020-11-17 DIAGNOSIS — L03031 Cellulitis of right toe: Secondary | ICD-10-CM | POA: Diagnosis not present

## 2020-11-17 DIAGNOSIS — Z89411 Acquired absence of right great toe: Secondary | ICD-10-CM | POA: Diagnosis not present

## 2020-11-17 DIAGNOSIS — L84 Corns and callosities: Secondary | ICD-10-CM | POA: Diagnosis not present

## 2020-11-17 DIAGNOSIS — E1051 Type 1 diabetes mellitus with diabetic peripheral angiopathy without gangrene: Secondary | ICD-10-CM | POA: Diagnosis not present

## 2020-11-24 DIAGNOSIS — E1051 Type 1 diabetes mellitus with diabetic peripheral angiopathy without gangrene: Secondary | ICD-10-CM | POA: Diagnosis not present

## 2020-11-24 DIAGNOSIS — E782 Mixed hyperlipidemia: Secondary | ICD-10-CM | POA: Diagnosis not present

## 2020-11-24 DIAGNOSIS — I10 Essential (primary) hypertension: Secondary | ICD-10-CM | POA: Diagnosis not present

## 2020-11-24 DIAGNOSIS — I739 Peripheral vascular disease, unspecified: Secondary | ICD-10-CM | POA: Diagnosis not present

## 2020-11-24 DIAGNOSIS — Z89411 Acquired absence of right great toe: Secondary | ICD-10-CM | POA: Diagnosis not present

## 2020-11-29 DIAGNOSIS — E1140 Type 2 diabetes mellitus with diabetic neuropathy, unspecified: Secondary | ICD-10-CM | POA: Diagnosis not present

## 2020-11-29 DIAGNOSIS — E1040 Type 1 diabetes mellitus with diabetic neuropathy, unspecified: Secondary | ICD-10-CM | POA: Diagnosis not present

## 2020-12-08 DIAGNOSIS — I739 Peripheral vascular disease, unspecified: Secondary | ICD-10-CM | POA: Diagnosis not present

## 2020-12-08 DIAGNOSIS — Z89411 Acquired absence of right great toe: Secondary | ICD-10-CM | POA: Diagnosis not present

## 2020-12-08 DIAGNOSIS — E1051 Type 1 diabetes mellitus with diabetic peripheral angiopathy without gangrene: Secondary | ICD-10-CM | POA: Diagnosis not present

## 2021-01-09 DIAGNOSIS — E1042 Type 1 diabetes mellitus with diabetic polyneuropathy: Secondary | ICD-10-CM | POA: Diagnosis not present

## 2021-01-09 DIAGNOSIS — E1065 Type 1 diabetes mellitus with hyperglycemia: Secondary | ICD-10-CM | POA: Diagnosis not present

## 2021-02-09 DIAGNOSIS — E1042 Type 1 diabetes mellitus with diabetic polyneuropathy: Secondary | ICD-10-CM | POA: Diagnosis not present

## 2021-02-09 DIAGNOSIS — E1065 Type 1 diabetes mellitus with hyperglycemia: Secondary | ICD-10-CM | POA: Diagnosis not present

## 2021-03-02 DIAGNOSIS — E1140 Type 2 diabetes mellitus with diabetic neuropathy, unspecified: Secondary | ICD-10-CM | POA: Diagnosis not present

## 2021-03-02 DIAGNOSIS — E1040 Type 1 diabetes mellitus with diabetic neuropathy, unspecified: Secondary | ICD-10-CM | POA: Diagnosis not present

## 2021-03-07 DIAGNOSIS — E109 Type 1 diabetes mellitus without complications: Secondary | ICD-10-CM | POA: Diagnosis not present

## 2021-03-07 DIAGNOSIS — Z794 Long term (current) use of insulin: Secondary | ICD-10-CM | POA: Diagnosis not present

## 2021-03-09 DIAGNOSIS — E1065 Type 1 diabetes mellitus with hyperglycemia: Secondary | ICD-10-CM | POA: Diagnosis not present

## 2021-03-09 DIAGNOSIS — E1042 Type 1 diabetes mellitus with diabetic polyneuropathy: Secondary | ICD-10-CM | POA: Diagnosis not present

## 2021-04-07 DIAGNOSIS — Z794 Long term (current) use of insulin: Secondary | ICD-10-CM | POA: Diagnosis not present

## 2021-04-07 DIAGNOSIS — E109 Type 1 diabetes mellitus without complications: Secondary | ICD-10-CM | POA: Diagnosis not present

## 2021-04-09 DIAGNOSIS — E1051 Type 1 diabetes mellitus with diabetic peripheral angiopathy without gangrene: Secondary | ICD-10-CM | POA: Diagnosis not present

## 2021-04-09 DIAGNOSIS — Z89411 Acquired absence of right great toe: Secondary | ICD-10-CM | POA: Diagnosis not present

## 2021-04-09 DIAGNOSIS — L84 Corns and callosities: Secondary | ICD-10-CM | POA: Diagnosis not present

## 2021-04-09 DIAGNOSIS — B353 Tinea pedis: Secondary | ICD-10-CM | POA: Diagnosis not present

## 2021-04-26 DIAGNOSIS — E782 Mixed hyperlipidemia: Secondary | ICD-10-CM | POA: Diagnosis not present

## 2021-04-26 DIAGNOSIS — Z9641 Presence of insulin pump (external) (internal): Secondary | ICD-10-CM | POA: Diagnosis not present

## 2021-04-26 DIAGNOSIS — E1051 Type 1 diabetes mellitus with diabetic peripheral angiopathy without gangrene: Secondary | ICD-10-CM | POA: Diagnosis not present

## 2021-04-26 DIAGNOSIS — I10 Essential (primary) hypertension: Secondary | ICD-10-CM | POA: Diagnosis not present

## 2021-04-26 DIAGNOSIS — E890 Postprocedural hypothyroidism: Secondary | ICD-10-CM | POA: Diagnosis not present

## 2021-04-26 DIAGNOSIS — E1042 Type 1 diabetes mellitus with diabetic polyneuropathy: Secondary | ICD-10-CM | POA: Diagnosis not present

## 2021-04-26 DIAGNOSIS — Z89411 Acquired absence of right great toe: Secondary | ICD-10-CM | POA: Diagnosis not present

## 2021-04-26 DIAGNOSIS — E1065 Type 1 diabetes mellitus with hyperglycemia: Secondary | ICD-10-CM | POA: Diagnosis not present

## 2021-05-08 DIAGNOSIS — Z794 Long term (current) use of insulin: Secondary | ICD-10-CM | POA: Diagnosis not present

## 2021-05-08 DIAGNOSIS — E109 Type 1 diabetes mellitus without complications: Secondary | ICD-10-CM | POA: Diagnosis not present

## 2021-06-05 DIAGNOSIS — E109 Type 1 diabetes mellitus without complications: Secondary | ICD-10-CM | POA: Diagnosis not present

## 2021-06-05 DIAGNOSIS — Z794 Long term (current) use of insulin: Secondary | ICD-10-CM | POA: Diagnosis not present

## 2021-06-06 DIAGNOSIS — I739 Peripheral vascular disease, unspecified: Secondary | ICD-10-CM | POA: Diagnosis not present

## 2021-06-06 DIAGNOSIS — E7800 Pure hypercholesterolemia, unspecified: Secondary | ICD-10-CM | POA: Diagnosis not present

## 2021-06-06 DIAGNOSIS — E10319 Type 1 diabetes mellitus with unspecified diabetic retinopathy without macular edema: Secondary | ICD-10-CM | POA: Diagnosis not present

## 2021-06-06 DIAGNOSIS — E1040 Type 1 diabetes mellitus with diabetic neuropathy, unspecified: Secondary | ICD-10-CM | POA: Diagnosis not present

## 2021-06-06 DIAGNOSIS — E039 Hypothyroidism, unspecified: Secondary | ICD-10-CM | POA: Diagnosis not present

## 2021-06-06 DIAGNOSIS — N182 Chronic kidney disease, stage 2 (mild): Secondary | ICD-10-CM | POA: Diagnosis not present

## 2021-06-06 DIAGNOSIS — Z789 Other specified health status: Secondary | ICD-10-CM | POA: Diagnosis not present

## 2021-06-06 DIAGNOSIS — E1021 Type 1 diabetes mellitus with diabetic nephropathy: Secondary | ICD-10-CM | POA: Diagnosis not present

## 2021-06-06 DIAGNOSIS — I10 Essential (primary) hypertension: Secondary | ICD-10-CM | POA: Diagnosis not present

## 2021-06-06 DIAGNOSIS — Z6821 Body mass index (BMI) 21.0-21.9, adult: Secondary | ICD-10-CM | POA: Diagnosis not present

## 2021-06-06 DIAGNOSIS — Z0000 Encounter for general adult medical examination without abnormal findings: Secondary | ICD-10-CM | POA: Diagnosis not present

## 2021-06-22 DIAGNOSIS — E875 Hyperkalemia: Secondary | ICD-10-CM | POA: Diagnosis not present

## 2021-07-26 DIAGNOSIS — E782 Mixed hyperlipidemia: Secondary | ICD-10-CM | POA: Diagnosis not present

## 2021-07-26 DIAGNOSIS — I10 Essential (primary) hypertension: Secondary | ICD-10-CM | POA: Diagnosis not present

## 2021-07-26 DIAGNOSIS — Z9641 Presence of insulin pump (external) (internal): Secondary | ICD-10-CM | POA: Diagnosis not present

## 2021-07-26 DIAGNOSIS — E1042 Type 1 diabetes mellitus with diabetic polyneuropathy: Secondary | ICD-10-CM | POA: Diagnosis not present

## 2021-07-26 DIAGNOSIS — E890 Postprocedural hypothyroidism: Secondary | ICD-10-CM | POA: Diagnosis not present

## 2021-08-01 DIAGNOSIS — H59812 Chorioretinal scars after surgery for detachment, left eye: Secondary | ICD-10-CM | POA: Diagnosis not present

## 2021-09-11 DIAGNOSIS — E7800 Pure hypercholesterolemia, unspecified: Secondary | ICD-10-CM | POA: Diagnosis not present

## 2021-09-11 DIAGNOSIS — E1040 Type 1 diabetes mellitus with diabetic neuropathy, unspecified: Secondary | ICD-10-CM | POA: Diagnosis not present

## 2021-09-20 DIAGNOSIS — E109 Type 1 diabetes mellitus without complications: Secondary | ICD-10-CM | POA: Diagnosis not present

## 2021-09-20 DIAGNOSIS — Z794 Long term (current) use of insulin: Secondary | ICD-10-CM | POA: Diagnosis not present

## 2021-10-29 DIAGNOSIS — I10 Essential (primary) hypertension: Secondary | ICD-10-CM | POA: Diagnosis not present

## 2021-10-29 DIAGNOSIS — E782 Mixed hyperlipidemia: Secondary | ICD-10-CM | POA: Diagnosis not present

## 2021-10-29 DIAGNOSIS — Z9641 Presence of insulin pump (external) (internal): Secondary | ICD-10-CM | POA: Diagnosis not present

## 2021-10-29 DIAGNOSIS — E890 Postprocedural hypothyroidism: Secondary | ICD-10-CM | POA: Diagnosis not present

## 2021-10-29 DIAGNOSIS — E1042 Type 1 diabetes mellitus with diabetic polyneuropathy: Secondary | ICD-10-CM | POA: Diagnosis not present

## 2021-12-07 DIAGNOSIS — I10 Essential (primary) hypertension: Secondary | ICD-10-CM | POA: Diagnosis not present

## 2021-12-07 DIAGNOSIS — Z87891 Personal history of nicotine dependence: Secondary | ICD-10-CM | POA: Diagnosis not present

## 2021-12-07 DIAGNOSIS — I739 Peripheral vascular disease, unspecified: Secondary | ICD-10-CM | POA: Diagnosis not present

## 2021-12-07 DIAGNOSIS — E785 Hyperlipidemia, unspecified: Secondary | ICD-10-CM | POA: Diagnosis not present

## 2021-12-07 DIAGNOSIS — Z7982 Long term (current) use of aspirin: Secondary | ICD-10-CM | POA: Diagnosis not present

## 2021-12-07 DIAGNOSIS — Z7902 Long term (current) use of antithrombotics/antiplatelets: Secondary | ICD-10-CM | POA: Diagnosis not present

## 2021-12-07 DIAGNOSIS — E782 Mixed hyperlipidemia: Secondary | ICD-10-CM | POA: Diagnosis not present

## 2021-12-07 DIAGNOSIS — E119 Type 2 diabetes mellitus without complications: Secondary | ICD-10-CM | POA: Diagnosis not present

## 2021-12-10 ENCOUNTER — Encounter: Payer: Self-pay | Admitting: Internal Medicine

## 2021-12-10 ENCOUNTER — Ambulatory Visit (INDEPENDENT_AMBULATORY_CARE_PROVIDER_SITE_OTHER): Payer: Medicare Other | Admitting: Internal Medicine

## 2021-12-10 VITALS — BP 108/74 | HR 101 | Temp 97.7°F | Resp 18 | Ht 65.0 in | Wt 120.6 lb

## 2021-12-10 DIAGNOSIS — E1021 Type 1 diabetes mellitus with diabetic nephropathy: Secondary | ICD-10-CM | POA: Insufficient documentation

## 2021-12-10 DIAGNOSIS — N182 Chronic kidney disease, stage 2 (mild): Secondary | ICD-10-CM | POA: Diagnosis not present

## 2021-12-10 DIAGNOSIS — E1042 Type 1 diabetes mellitus with diabetic polyneuropathy: Secondary | ICD-10-CM

## 2021-12-10 NOTE — Assessment & Plan Note (Signed)
His notes from ENDO were reviewed.  They have not changed anything in his regimen.  Continue with his CGM and insulin pump.

## 2021-12-10 NOTE — Progress Notes (Signed)
Office Visit  Subjective   Patient ID: Samuel [name]   DOB: 12/01/1965   Age: 56 y.o.   MRN: [id number]   Chief Complaint Chief Complaint  Patient presents with   Follow-up     History of Present Illness [name] returns for a follow-up visit for his Type 1 diabetes.  Over the interim, he did see Dr. Allena Katz in ENDO on 10/29/2021 and they repeated his HgBA1c and it was 7.8%.  They decided not to change anything in his regimen at that time.   This past year, he had an brasion on the lateral side of his left foot.  This is now resolved.  However he now has a new abrasion located on the top of his left foot more medially.  He saw the vascular surgeon last week who cleaned it and told him about bandaging this.  This wound is about the size of a dime and is not infected and has not worsened.  Endo noted he had some episodes of hypoglycemia.  These occur usually when he is working and hasn't eaten.  He remains with an insulin pump using Humalog U-100.  He does see endocrinology every 3 months where on his last visit they did not adjust his insulin pump.  His last HgBA1c was done on 10/29/2021 and was 7.8%.   I did his HgBa1c on 09/11/2021 and it was 8.3%.  The patient is currently using a DEXCOM CMG for blood sugar monitoring.   His blood sugars on his memory show they usually run 80-250.  He specifically denies unexplained abdominal pain, nausea or vomiting.  He came in fasting today in anticipation of lab work.  He has a history of intolerance to ACE-I due to diarrhea. He has chronic complications of diabetic retinopathy, diabetic neuropathy and diabetic nephropathy with Stage II CKD.  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  We referred him to urology but he never went due to a death in the family.  He does see Dr. Precious Bard where his yearly eye exam was in 09/2021.          Past Medical  History History reviewed. No pertinent past medical history.   Allergies Allergies  Allergen Reactions   Atorvastatin Other (See Comments)    MYALGIAS  Myalgia.   Colesevelam Other (See Comments)    Myalgia.   Ramipril Diarrhea     Review of Systems Review of Systems  Constitutional:  Negative for chills and fever.  Eyes:  Negative for blurred vision and double vision.  Respiratory:  Negative for cough and shortness of breath.   Cardiovascular:  Negative for chest pain, palpitations and leg swelling.  Gastrointestinal:  Negative for constipation, diarrhea, nausea and vomiting.  Musculoskeletal:  Negative for myalgias.  Skin:  Negative for itching and rash.  Neurological:  Negative for dizziness, weakness and headaches.       Objective:    Vitals BP 108/74 (BP Location: Left Arm, Patient Position: Sitting, Cuff Size: Normal)   Pulse (!) 101   Temp 97.7 F (36.5 C) (Temporal)   Resp 18   Ht 5\' 5"  (1.651 m)   Wt 120 lb 9.6 oz (54.7 kg)   SpO2 98%   BMI 20.07 kg/m    Physical Examination Physical Exam Constitutional:      Appearance: Normal appearance. He is not ill-appearing.  Cardiovascular:  Rate and Rhythm: Normal rate and regular rhythm.     Pulses: Normal pulses.     Heart sounds: Normal heart sounds. No murmur heard.    No friction rub. No gallop.  Pulmonary:     Effort: Pulmonary effort is normal. No respiratory distress.     Breath sounds: Normal breath sounds. No wheezing, rhonchi or rales.  Abdominal:     General: Abdomen is flat. Bowel sounds are normal. There is no distension.     Palpations: Abdomen is soft.     Tenderness: There is no abdominal tenderness.  Musculoskeletal:     Right lower leg: No edema.     Left lower leg: No edema.  Skin:    General: Skin is warm and dry.     Findings: No rash.  Neurological:     Mental Status: He is alert.        Assessment & Plan:   Diabetic polyneuropathy associated with type 1 diabetes  mellitus ([place]) His notes from ENDO were reviewed.  They have not changed anything in his regimen.  Continue with his CGM and insulin pump.  Diabetic nephropathy associated with type 1 diabetes mellitus ([place]) He remains with regular visits with myself and endo and we are managing his diabetes.  I will him to avoid NSAIDS and nephrotoxins as able.  He remains on an ARB.  Stage 2 chronic kidney disease I had a discussion with him to avoid ibuprofen/NSAIDS.    Return in about 3 months (around 03/11/2022).   Townsend Roger, MD

## 2021-12-10 NOTE — Assessment & Plan Note (Signed)
I had a discussion with him to avoid ibuprofen/NSAIDS.

## 2021-12-10 NOTE — Assessment & Plan Note (Signed)
He remains with regular visits with myself and endo and we are managing his diabetes.  I will him to avoid NSAIDS and nephrotoxins as able.  He remains on an ARB.

## 2021-12-11 DIAGNOSIS — Z794 Long term (current) use of insulin: Secondary | ICD-10-CM | POA: Diagnosis not present

## 2021-12-11 DIAGNOSIS — E109 Type 1 diabetes mellitus without complications: Secondary | ICD-10-CM | POA: Diagnosis not present

## 2021-12-26 ENCOUNTER — Other Ambulatory Visit: Payer: Self-pay

## 2021-12-26 MED ORDER — LEVOTHYROXINE SODIUM 100 MCG PO TABS: 100.0000 ug | ORAL_TABLET | Freq: Every day | ORAL | 1 refills | Status: DC

## 2022-01-01 ENCOUNTER — Other Ambulatory Visit: Payer: Self-pay

## 2022-01-01 MED ORDER — LEVOTHYROXINE SODIUM 125 MCG PO TABS: 125.0000 ug | ORAL_TABLET | Freq: Every day | ORAL | 1 refills | Status: DC

## 2022-01-14 ENCOUNTER — Other Ambulatory Visit: Payer: Self-pay

## 2022-01-14 MED ORDER — CLOPIDOGREL BISULFATE 75 MG PO TABS: 75.0000 mg | ORAL_TABLET | Freq: Every day | ORAL | 1 refills | Status: DC

## 2022-01-14 MED ORDER — OLMESARTAN MEDOXOMIL 20 MG PO TABS: 20.0000 mg | ORAL_TABLET | Freq: Every day | ORAL | 1 refills | Status: DC

## 2022-01-14 MED ORDER — LEVOTHYROXINE SODIUM 125 MCG PO TABS: 125.0000 ug | ORAL_TABLET | Freq: Every day | ORAL | 1 refills | Status: DC

## 2022-02-24 ENCOUNTER — Other Ambulatory Visit: Payer: Self-pay | Admitting: Internal Medicine

## 2022-03-01 DIAGNOSIS — E109 Type 1 diabetes mellitus without complications: Secondary | ICD-10-CM | POA: Diagnosis not present

## 2022-03-01 DIAGNOSIS — Z794 Long term (current) use of insulin: Secondary | ICD-10-CM | POA: Diagnosis not present

## 2022-03-05 ENCOUNTER — Encounter: Payer: Self-pay | Admitting: Pharmacist

## 2022-03-05 NOTE — Progress Notes (Signed)
[place] [facility] Team Statin Quality Measure Assessment   03/05/2022  JAMESPATRICK CHARTRAND [birth year] [id number]  Per review of chart and payor information, Mr. Loadholt has a diagnosis of diabetes but is not currently filling a statin prescription.  This places patient into the Statin Use In Patients with Diabetes (SUPD) measure for CMS.    Patient has documented trials of statins with reported muscle aches/pains, but no corresponding CPT codes that would exclude patient from SUPD measure.  Please consider evaluating him for past statin intolerance and add the appropriate diagnosis code to tomorrow's office visit.  Applying a Z code will not remove the patient from the measure.  Initiate low intensity          statin with reduced frequency if prior          statin intolerance 1x weekly, #13, 3 refills   2x weekly, #26, 3 refills   3x weekly, #39, 3 refills    Code for past statin intolerance or  other exclusions (required annually)  Provider Requirements: Associate code during an office visit or telehealth encounter  Drug Induced Myopathy G72.0   Myopathy, unspecified G72.9   Myositis, unspecified M60.9   Rhabdomyolysis M62.82   Cirrhosis of liver K74.69   Prediabetes R73.03   PCOS E28.2   Thank you for allowing [facility] pharmacy to be a part of this patient's care. Curlene Labrum, PharmD [facility] Pharmacist Office: [phone]

## 2022-03-06 ENCOUNTER — Encounter: Payer: Self-pay | Admitting: Internal Medicine

## 2022-03-06 ENCOUNTER — Ambulatory Visit: Payer: 59 | Admitting: Internal Medicine

## 2022-03-06 VITALS — BP 116/64 | HR 91 | Temp 98.0°F | Resp 16 | Ht 65.0 in | Wt 119.8 lb

## 2022-03-06 DIAGNOSIS — E1042 Type 1 diabetes mellitus with diabetic polyneuropathy: Secondary | ICD-10-CM | POA: Diagnosis not present

## 2022-03-06 MED ORDER — GVOKE HYPOPEN 2-PACK 1 MG/0.2ML ~~LOC~~ SOAJ: 1.0000 mg | Freq: Once | SUBCUTANEOUS | 1 refills | Status: AC
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
## 2022-03-06 NOTE — Assessment & Plan Note (Signed)
He states he has hypoglycemia at times at night and has to eat at night time.  I asked him to cut this back and confer with Dr. Posey Pronto.  I am going to write him for a G-voke pen as needed for emergencies.  He goes back next month to see Dr. Posey Pronto.

## 2022-03-07 LAB — HEMOGLOBIN A1C
Est. average glucose Bld gHb Est-mCnc: 186 mg/dL
Hgb A1c MFr Bld: 8.1 % — ABNORMAL HIGH (ref 4.8–5.6)

## 2022-03-12 DIAGNOSIS — W19XXXA Unspecified fall, initial encounter: Secondary | ICD-10-CM | POA: Diagnosis not present

## 2022-03-12 DIAGNOSIS — E161 Other hypoglycemia: Secondary | ICD-10-CM | POA: Diagnosis not present

## 2022-03-12 DIAGNOSIS — R739 Hyperglycemia, unspecified: Secondary | ICD-10-CM | POA: Diagnosis not present

## 2022-03-13 DIAGNOSIS — T68XXXA Hypothermia, initial encounter: Secondary | ICD-10-CM | POA: Diagnosis not present

## 2022-03-13 DIAGNOSIS — S199XXA Unspecified injury of neck, initial encounter: Secondary | ICD-10-CM | POA: Diagnosis not present

## 2022-03-13 DIAGNOSIS — E10649 Type 1 diabetes mellitus with hypoglycemia without coma: Secondary | ICD-10-CM | POA: Diagnosis not present

## 2022-03-13 DIAGNOSIS — R9431 Abnormal electrocardiogram [ECG] [EKG]: Secondary | ICD-10-CM | POA: Diagnosis not present

## 2022-03-13 DIAGNOSIS — S0990XA Unspecified injury of head, initial encounter: Secondary | ICD-10-CM | POA: Diagnosis not present

## 2022-03-13 DIAGNOSIS — Z9641 Presence of insulin pump (external) (internal): Secondary | ICD-10-CM | POA: Diagnosis not present

## 2022-03-13 DIAGNOSIS — E11649 Type 2 diabetes mellitus with hypoglycemia without coma: Secondary | ICD-10-CM | POA: Diagnosis not present

## 2022-03-13 DIAGNOSIS — Z87891 Personal history of nicotine dependence: Secondary | ICD-10-CM | POA: Diagnosis not present

## 2022-03-13 DIAGNOSIS — Z794 Long term (current) use of insulin: Secondary | ICD-10-CM | POA: Diagnosis not present

## 2022-03-13 DIAGNOSIS — R55 Syncope and collapse: Secondary | ICD-10-CM | POA: Diagnosis not present

## 2022-03-13 DIAGNOSIS — Z556 Problems related to health literacy: Secondary | ICD-10-CM | POA: Diagnosis not present

## 2022-03-13 DIAGNOSIS — Z5329 Procedure and treatment not carried out because of patient's decision for other reasons: Secondary | ICD-10-CM | POA: Diagnosis not present

## 2022-04-08 ENCOUNTER — Other Ambulatory Visit: Payer: Self-pay | Admitting: Internal Medicine

## 2022-04-11 DIAGNOSIS — E782 Mixed hyperlipidemia: Secondary | ICD-10-CM | POA: Diagnosis not present

## 2022-04-11 DIAGNOSIS — E1042 Type 1 diabetes mellitus with diabetic polyneuropathy: Secondary | ICD-10-CM | POA: Diagnosis not present

## 2022-04-11 DIAGNOSIS — E1051 Type 1 diabetes mellitus with diabetic peripheral angiopathy without gangrene: Secondary | ICD-10-CM | POA: Diagnosis not present

## 2022-04-11 DIAGNOSIS — E890 Postprocedural hypothyroidism: Secondary | ICD-10-CM | POA: Diagnosis not present

## 2022-04-11 DIAGNOSIS — Z9641 Presence of insulin pump (external) (internal): Secondary | ICD-10-CM | POA: Diagnosis not present

## 2022-04-25 DIAGNOSIS — E1042 Type 1 diabetes mellitus with diabetic polyneuropathy: Secondary | ICD-10-CM | POA: Diagnosis not present

## 2022-04-25 DIAGNOSIS — E1051 Type 1 diabetes mellitus with diabetic peripheral angiopathy without gangrene: Secondary | ICD-10-CM | POA: Diagnosis not present

## 2022-05-08 DIAGNOSIS — E1042 Type 1 diabetes mellitus with diabetic polyneuropathy: Secondary | ICD-10-CM | POA: Diagnosis not present

## 2022-05-08 DIAGNOSIS — E1051 Type 1 diabetes mellitus with diabetic peripheral angiopathy without gangrene: Secondary | ICD-10-CM | POA: Diagnosis not present

## 2022-05-30 DIAGNOSIS — E109 Type 1 diabetes mellitus without complications: Secondary | ICD-10-CM | POA: Diagnosis not present

## 2022-06-10 ENCOUNTER — Ambulatory Visit: Payer: 59 | Admitting: Internal Medicine

## 2022-06-10 ENCOUNTER — Encounter: Payer: Self-pay | Admitting: Internal Medicine

## 2022-06-10 VITALS — BP 118/70 | HR 86 | Temp 98.6°F | Resp 16 | Ht 65.0 in | Wt 120.0 lb

## 2022-06-10 DIAGNOSIS — L723 Sebaceous cyst: Secondary | ICD-10-CM

## 2022-06-10 DIAGNOSIS — E1042 Type 1 diabetes mellitus with diabetic polyneuropathy: Secondary | ICD-10-CM | POA: Diagnosis not present

## 2022-06-10 DIAGNOSIS — L97519 Non-pressure chronic ulcer of other part of right foot with unspecified severity: Secondary | ICD-10-CM

## 2022-06-10 DIAGNOSIS — E10621 Type 1 diabetes mellitus with foot ulcer: Secondary | ICD-10-CM | POA: Diagnosis not present

## 2022-06-10 NOTE — Progress Notes (Signed)
Established Patient Office Visit  Subjective   Patient ID: Samuel [name], male    DOB: 1965-04-01  Age: 57 y.o. MRN: [id number]   Chief Complaint: 3 month follow up  HPI  Samuel [name] is a 57 yo male who returns for a follow-up visit for his Type 1 diabetes.  He states "my insulin drops low every night the lowest in the 40s" but he states that he manages and has not passed out. Today his glucose readings are in a range of 100-270. He continues to see Dr. Allena Katz in ENDO last visit was on 04/11/2022 his HgBA1c and it was 7.8%. At that appointment he was referred to Diabetes Education and Medical Nutrition Therapy. At that appointment with the coordinator his Vira Agar was adjusted but there was an issue with upgrade his device. He is currently doing everything manually, he will see Dr. Allena Katz in July 2024. He remains with an insulin pump using Humalog U-100. Denies chest pain, weakness, dizziness.  This past year, he had an brasion on the lateral side of his left foot which resolved but he has some new lesions on both feet but these are now minimal and almost completely healed. Today he reports that the area on the right foot has improved and almost completely healed. He reports a small area on the lower left abdomen that produces a white substance when he squeezes it. He denies any pain but reports discomfort. We will refer him to dermatology.  He specifically denies unexplained abdominal pain, nausea or vomiting. He has a history of intolerance to ACE-I due to diarrhea. He has chronic complications of diabetic retinopathy, diabetic neuropathy and diabetic nephropathy with Stage II CKD.       Review of Systems  Eyes:  Negative for blurred vision and double vision.  Respiratory: Negative.    Cardiovascular:  Negative for chest pain and palpitations.  Musculoskeletal: Negative.   Skin: Negative.   Neurological: Negative.  Negative for dizziness, tingling and headaches.  Endo/Heme/Allergies:  Negative.   Psychiatric/Behavioral: Negative.        Objective:     BP 118/70   Pulse 86   Temp 98.6 F (37 C)   Resp 16   Ht 5\' 5"  (1.651 m)   Wt 120 lb (54.4 kg)   SpO2 97%   BMI 19.97 kg/m    Physical Exam Constitutional:      Appearance: Normal appearance.  Eyes:     Pupils: Pupils are equal, round, and reactive to light.  Cardiovascular:     Rate and Rhythm: Normal rate and regular rhythm.     Heart sounds: Normal heart sounds.  Pulmonary:     Effort: Pulmonary effort is normal.     Breath sounds: Normal breath sounds.  Abdominal:     General: There is no distension.     Palpations: Abdomen is soft.     Tenderness: There is no abdominal tenderness.  Musculoskeletal:        General: Normal range of motion.  Skin:    General: Skin is warm and dry.     Findings: Abscess (cyst to the left lower abdomen) present.       Neurological:     Mental Status: He is alert and oriented to person, place, and time.  Psychiatric:        Mood and Affect: Mood normal.        Behavior: Behavior normal.         Assessment & Plan:  Problem List Items Addressed This Visit       Endocrine   Diabetic ulcer of toe of right foot associated with type 1 diabetes mellitus ([facility])    He will continue to monitor and examine his feet as well as see podiatry.       Diabetic polyneuropathy associated with type 1 diabetes mellitus ([facility]) - Primary    He will have a visit with Dr. Allena Katz in July 2024 ENDO for maintenance of his dexcom along with A1c check. He will continue to monitor his cgm readings and increase fluids as needed. He does have Gvoke for hypoglycemic events. A yearly eye and foot exam is needed this year.         Musculoskeletal and Integument   Sebaceous cyst    He is being referred to dermatology       Relevant Orders   Ambulatory referral to Dermatology    Return in about 3 months (around 09/10/2022) for annual exam .    Edwena Blow, NP

## 2022-06-10 NOTE — Assessment & Plan Note (Signed)
He is being referred to dermatology

## 2022-06-10 NOTE — Assessment & Plan Note (Signed)
He will continue to monitor and examine his feet as well as see podiatry.

## 2022-06-10 NOTE — Assessment & Plan Note (Addendum)
He will have a visit with Dr. Allena Katz in July 2024 ENDO for maintenance of his dexcom along with A1c check. He will continue to monitor his cgm readings and increase fluids as needed. He does have Gvoke for hypoglycemic events. A yearly eye and foot exam is needed this year.

## 2022-07-18 DIAGNOSIS — L720 Epidermal cyst: Secondary | ICD-10-CM | POA: Diagnosis not present

## 2022-07-30 DIAGNOSIS — E1042 Type 1 diabetes mellitus with diabetic polyneuropathy: Secondary | ICD-10-CM | POA: Diagnosis not present

## 2022-07-30 DIAGNOSIS — Z9641 Presence of insulin pump (external) (internal): Secondary | ICD-10-CM | POA: Diagnosis not present

## 2022-07-30 DIAGNOSIS — E782 Mixed hyperlipidemia: Secondary | ICD-10-CM | POA: Diagnosis not present

## 2022-07-30 DIAGNOSIS — Z978 Presence of other specified devices: Secondary | ICD-10-CM | POA: Diagnosis not present

## 2022-07-30 DIAGNOSIS — I10 Essential (primary) hypertension: Secondary | ICD-10-CM | POA: Diagnosis not present

## 2022-08-05 DIAGNOSIS — L720 Epidermal cyst: Secondary | ICD-10-CM | POA: Diagnosis not present

## 2022-08-11 ENCOUNTER — Other Ambulatory Visit: Payer: Self-pay | Admitting: Internal Medicine

## 2022-08-12 DIAGNOSIS — E1042 Type 1 diabetes mellitus with diabetic polyneuropathy: Secondary | ICD-10-CM | POA: Diagnosis not present

## 2022-08-23 DIAGNOSIS — E109 Type 1 diabetes mellitus without complications: Secondary | ICD-10-CM | POA: Diagnosis not present

## 2022-08-28 ENCOUNTER — Other Ambulatory Visit: Payer: Self-pay | Admitting: Internal Medicine

## 2022-09-10 DIAGNOSIS — E1042 Type 1 diabetes mellitus with diabetic polyneuropathy: Secondary | ICD-10-CM | POA: Diagnosis not present

## 2022-09-24 ENCOUNTER — Encounter: Payer: Self-pay | Admitting: Internal Medicine

## 2022-09-24 ENCOUNTER — Ambulatory Visit: Payer: 59 | Admitting: Internal Medicine

## 2022-09-24 VITALS — BP 136/84 | HR 97 | Temp 98.1°F | Resp 17 | Ht 65.0 in | Wt 118.6 lb

## 2022-09-24 DIAGNOSIS — E1042 Type 1 diabetes mellitus with diabetic polyneuropathy: Secondary | ICD-10-CM

## 2022-09-24 DIAGNOSIS — I10 Essential (primary) hypertension: Secondary | ICD-10-CM | POA: Diagnosis not present

## 2022-09-24 DIAGNOSIS — J302 Other seasonal allergic rhinitis: Secondary | ICD-10-CM

## 2022-09-24 DIAGNOSIS — E10319 Type 1 diabetes mellitus with unspecified diabetic retinopathy without macular edema: Secondary | ICD-10-CM | POA: Diagnosis not present

## 2022-09-24 DIAGNOSIS — Z681 Body mass index (BMI) 19 or less, adult: Secondary | ICD-10-CM

## 2022-09-24 DIAGNOSIS — E782 Mixed hyperlipidemia: Secondary | ICD-10-CM

## 2022-09-24 DIAGNOSIS — E890 Postprocedural hypothyroidism: Secondary | ICD-10-CM

## 2022-09-24 DIAGNOSIS — N182 Chronic kidney disease, stage 2 (mild): Secondary | ICD-10-CM | POA: Diagnosis not present

## 2022-09-24 DIAGNOSIS — E1021 Type 1 diabetes mellitus with diabetic nephropathy: Secondary | ICD-10-CM

## 2022-09-24 DIAGNOSIS — Z0000 Encounter for general adult medical examination without abnormal findings: Secondary | ICD-10-CM | POA: Diagnosis not present

## 2022-09-24 DIAGNOSIS — I739 Peripheral vascular disease, unspecified: Secondary | ICD-10-CM | POA: Diagnosis not present

## 2022-09-24 DIAGNOSIS — H6123 Impacted cerumen, bilateral: Secondary | ICD-10-CM | POA: Insufficient documentation

## 2022-09-24 DIAGNOSIS — E7800 Pure hypercholesterolemia, unspecified: Secondary | ICD-10-CM | POA: Insufficient documentation

## 2022-09-24 MED ORDER — DAPAGLIFLOZIN PROPANEDIOL 10 MG PO TABS: 10.0000 mg | ORAL_TABLET | Freq: Every day | ORAL | 3 refills | Status: AC

## 2022-09-24 NOTE — Progress Notes (Signed)
Office Visit  Subjective   Patient ID: Samuel [name]   DOB: 1965-08-24   Age: 57 y.o.   MRN: [id number]   Chief Complaint Chief Complaint  Patient presents with   Follow-up     History of Present Illness Samuel [name] is a 57 year old Caucasian/White male who presents for his annual health maintenance exam. He is due for the following health maintenance studies: screening labs. This patient's past medical history Diabetes Mellitus, Type I, diabetic retinopathy, Hyperlipidemia, Hypertension, Hyperthyroidism, and Proteinuria.   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  They are following him diabetic retinopathy where they patient has had surgery in the past.  He has never had a colonoscopy and there is no family history of colon cancer. He has refused colonoscopy in the past and we did a cologuard 03/2016 and this was negative. He had another cologuard in 06/2020 and this was negative as well. He states he has urinary hestiancy and retention first in the morning.  The patient used to smoke but quit at age 54.  He smoked 1 ppd from age of 10 until he quit at 81.  The patient does exercise by walking and hunting. He does not get yearly flu vaccines. He has not had a pneumonia vaccine but he did take 2 COVID-19 vaccines but not any boosters. The patient denies any depression or anxiety. He is on Plavix 75mg  daily and ECASA 81mg  daily.   Samuel [name] is a 57 yo male who returns for a follow-up visit for his Type 1 diabetes.  Over the interim, he states that endocrinology has setup a CGM to act with his insulin pump.  On his last visit, he had a major hypoglycemia event where EMS was called.  Since then, he has not had any further hypoglycemia.   He did see Dr. Allena [name] in [facility] in 07/2022 and they repeated his HgBA1c and it  was 8.8%.  He has been seeing Samuel [name] who is the diabetic educator at [facility] and they have adjusted his pump and again his CGM is compatible with his insulin pump.   This past year, he had an brasion on the lateral side of his left foot which resolved but he has some new lesions on both feet but these are now resolved as well.  He has had episodes of hypoglycemia in the past that have been associated with not eating.  He remains with an insulin pump using Humalog U-100.   His last HgBA1c was done on 07/2022 and was 8.8%.   The patient is currently using a DEXCOM CMG for blood sugar monitoring but they just changed him to [facility] G7.   His blood sugars on his memory show they usually run 70-260.  He specifically denies unexplained abdominal pain, nausea or vomiting or diarrhea.  He came in fasting today in anticipation of lab work.  He has a history of intolerance to ACE-I due to diarrhea. He has chronic complications of diabetic retinopathy, diabetic neuropathy and diabetic nephropathy with Stage [name] CKD.  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  We referred him to urology but he never went due to a death in the family.  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    He does have a history of Stage [name] CKD which is due to his history of T1 diabetes and his HTN.  He has had some decreased kidney function since 2014 but his creatinine over the last 2-3 years has ranged 1.2-1.5 with a GFR of 55-68.  He cannot take an ACE-I due to a history of diarrhea.  The patient also has a history of peripheral arterial disease.  The patient has had a diabetic foot ulcer in the past of his right big toe in 2021.  We referred him to wound care where they  performed a workup and discovered on MRI that he had osteomyelitis of his right big toe.  The patient completed a 6 week course of antibiotics.  They did a vascular workup where he had an abnormal ABI of 0.64 on the right.  Her underwent pelvic and lower extremity arteriogram and it was discovered that he has PAD where he underwent revascularization of an occluded segment of his left superficial femoral artery on 08/30/2019.  He had combine atherectomy and balloon angioplasty of the right superficial femoral artery with single vessel runoff to the foot in the form of a hypertrophic peroneal artery.  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  He went just last week for combined atherectomy and balloon angioplasty where he had successful revascularization of his anterior tibial artery.  His wound slowly healed and he completed a course of hyperbaric oxygen treatments.  They performed an ECHO on 10/2015 and this was normal with an EF of 55%.  He is followed by vascular yearly with his last visit around 12/2021 where he states they have been doing arterial US's of his legs.  They want to continue medical management and discussed a dedicated walking program program to build collateral flow.  He remains on Plavix daily and ECASA 81mg  daily.  Samuel [name], [name] returns today for routine followup on his cholesterol.  Over the interim, he states his insurance would no longer cover repatha as he is not on a statin.   He has been on Lipitor, crestor, pravastatin, lovastatin and zocor in the past and could not tolerate this.  He quit his welchol due to myalgias.  We tried him on livalo 2mg  daily which he tolerated well.  However his insurance copay was $1000 dollars and he could not afford this medication.  I started him on repatha 140mg  twice monthly which he is tolerating well.  Overall, he states he is doing well and is without any complaints or problems at this time. He specifically  denies chest pain, shortness of breath, abdominal pain, nausea, vomiting, diarrhea, myalgias, and fatigue. He remains on Repatha 140mg  sub twice a month.  He has been on repatha since 03/2018.  He is fasting in anticipation for labs today.   The patient is a 57 year old Caucasian/White male who returns for a regularly scheduled thyroid check.  He was initially diagnosed with hyperthyroidism but he underwent iodine ablation and is now hypothyroidism.  He is on levothyoxine on fri, sat and sun and levothyxoine on Monday- Thur.  He claims to have no symptoms suggestive of thyroid imbalance specifically denying fatigue, cold intolerance, heat intolerance, tremors, anxiety, unexplained weight changes.  The patient is a 57  year old Caucasian/White male who presents for a follow-up evaluation of hypertension. The patient has not been checking his blood pressure at home. The patient's current medications include: Benicar 20 mg oral tablet daily.  The patient has been tolerating his medications well. The patient denies any headache, visual changes, dizziness, lightheadness, chest pain, shortness of breath, orthopnea, weakness/numbness, and edema. He reports there have been no other symptoms noted.   Samuel [name] also has a history of seasonal allergies.  The patient states early spring and winter time is the worse and he will have some wheezing.  He does complain of severe sneezing and watery itchy eyes and maybe has one ear infection per year.  I gave him an allergy shot in the past but he did not need this for this past year.  He has been using OTC allergy pills as needed.         Past Medical History Past Medical History:  Diagnosis Date   CKD (chronic kidney disease), stage [name]    Diabetic nephropathy associated with type 1 diabetes mellitus (HCC)    Diabetic retinopathy associated with type 1 diabetes mellitus (HCC)    Essential hypertension    Hyperlipidemia    Hyperthyroidism    PAD  (peripheral artery disease) (HCC)    Type 1 diabetes (HCC)      Allergies Allergies  Allergen Reactions   Atorvastatin Other (See Comments)    MYALGIAS  Myalgia.   Colesevelam Other (See Comments)    Myalgia.   Ramipril Diarrhea     Medications  Current Outpatient Medications:    aspirin EC 81 MG tablet, Take 81 mg by mouth once., Disp: , Rfl:    clopidogrel (PLAVIX) 75 MG tablet, TAKE 1 TABLET BY MOUTH EVERY DAY, Disp: 90 tablet, Rfl: 1   Evolocumab (REPATHA SURECLICK) 140 MG/ML SOAJ, INJECT 140MG  INTO THE SKIN TWICE A MONTH, Disp: 2 mL, Rfl: 11   HUMALOG 100 UNIT/ML cartridge, , Disp: , Rfl:    HUMALOG 100 UNIT/ML injection, , Disp: , Rfl:    latanoprost (XALATAN) 0.005 % ophthalmic solution, Place 1 drop into both eyes at bedtime., Disp: , Rfl:    levothyroxine (SYNTHROID) 100 MCG tablet, TAKE 1 TABLET BY MOUTH DAILY BEFORE BREAKFAST., Disp: 90 tablet, Rfl: 1   levothyroxine (SYNTHROID) 125 MCG tablet, TAKE 1 TABLET BY MOUTH DAILY BEFORE BREAKFAST., Disp: 90 tablet, Rfl: 1   olmesartan (BENICAR) 20 MG tablet, TAKE 1 TABLET BY MOUTH EVERY DAY, Disp: 90 tablet, Rfl: 1   Review of Systems Review of Systems  Constitutional:  Negative for chills and fever.  Eyes:  Negative for blurred vision and double vision.  Respiratory:  Negative for cough, shortness of breath and wheezing.   Cardiovascular:  Negative for chest pain, palpitations and leg swelling.  Gastrointestinal:  Negative for abdominal pain, blood in stool, constipation, diarrhea, heartburn, melena, nausea and vomiting.  Genitourinary:  Negative for frequency.  Musculoskeletal:  Negative for myalgias.  Skin:  Negative for itching and rash.  Neurological:  Negative for dizziness, weakness and headaches.  Endo/Heme/Allergies:  Negative for polydipsia.  Psychiatric/Behavioral:  Negative for depression. The patient is not nervous/anxious.        Objective:    Vitals BP 136/84   Pulse 97   Temp 98.1 F (36.7 C)    Resp 17   Ht 5\' 5"  (1.651 m)   Wt 118 lb 9.6 oz (53.8 kg)   SpO2 98%   BMI 19.74 kg/m  Physical Examination Physical Exam Constitutional:      Appearance: Normal appearance. He is not ill-appearing.  HENT:     Head: Normocephalic and atraumatic.     Ears:     Comments: He has bilateral cerumen impaction    Mouth/Throat:     Mouth: Mucous membranes are moist.     Pharynx: Oropharynx is clear. No oropharyngeal exudate or posterior oropharyngeal erythema.  Eyes:     General: No scleral icterus.    Conjunctiva/sclera: Conjunctivae normal.     Pupils: Pupils are equal, round, and reactive to light.  Neck:     Vascular: No carotid bruit.  Cardiovascular:     Rate and Rhythm: Normal rate and regular rhythm.     Pulses: Normal pulses.     Heart sounds: No murmur heard.    No friction rub. No gallop.  Pulmonary:     Effort: Pulmonary effort is normal. No respiratory distress.     Breath sounds: No wheezing, rhonchi or rales.  Abdominal:     General: Abdomen is flat. Bowel sounds are normal. There is no distension.     Palpations: Abdomen is soft.     Tenderness: There is no abdominal tenderness.  Musculoskeletal:     Cervical back: Neck supple. No tenderness.     Right lower leg: No edema.     Left lower leg: No edema.  Lymphadenopathy:     Cervical: No cervical adenopathy.  Skin:    General: Skin is warm and dry.     Findings: No rash.  Neurological:     General: No focal deficit present.     Mental Status: He is alert and oriented to person, place, and time.  Psychiatric:        Mood and Affect: Mood normal.        Behavior: Behavior normal.        Assessment & Plan:   PAD (peripheral artery disease) (HCC) We will continue risk factor modification.  He goes yearly to see vascular surgery.  We will continue with plavix and ECASA.  He has had osteomyelitis of his big toe with amputation in the past on the right.  Essential hypertension His BP is under control.   We will continue on his benciar.  Type 1 diabetes mellitus (HCC) He is being followed by endo and remains with his CGM and insulin pump.  The diabetic educator has recently adjusted his insulin pump and I have read their plan and back up plans for the patient.    Diabetic nephropathy associated with type 1 diabetes mellitus (HCC) He has diabetic nephropathy related to his T1 diabetes.  I had a discussion with nephrology as the patient has had proteinuria.  We will start him on farxiga off FDA label for his CKD.  We will check his urine studies today.  Postablative hypothyroidism He seems to be euthyroid.  We will check his TFT's today.  Diabetic retinopathy associated with type 1 diabetes mellitus (HCC) He will followup with optometry and we will make an appointment for his yearly eye exam.  Diabetic polyneuropathy associated with type 1 diabetes mellitus (HCC) We did his diabetic foot exam and he could not feel his feet.  I had a discussion with him regarding foot care.  Bilateral impacted cerumen We have irrigated his ears today.  Stage 2 chronic kidney disease Plan as above.  I want him to avoid NSAIDS.  Seasonal allergies He will continue OTC meds as needed.  Mixed hyperlipidemia  We will check his FLP where I think his goal LDL probably needs to be <70 with his history of T1 diabetes and PAD.  Body mass index (BMI) of 19.0 to 19.9 in adult I want him to eat healthy and continue to be active.  Annual physical exam Health maintenance discussed.  He does not want a colonoscopy and will need a cologuard next year.  We will obtain some yearly labs.    Return in about 3 months (around 12/24/2022).   Crist Fat, MD

## 2022-09-24 NOTE — Assessment & Plan Note (Signed)
We have irrigated his ears today.

## 2022-09-24 NOTE — Assessment & Plan Note (Signed)
He is being followed by endo and remains with his CGM and insulin pump.  The diabetic educator has recently adjusted his insulin pump and I have read their plan and back up plans for the patient.

## 2022-09-24 NOTE — Assessment & Plan Note (Signed)
We did his diabetic foot exam and he could not feel his feet.  I had a discussion with him regarding foot care.

## 2022-09-24 NOTE — Assessment & Plan Note (Signed)
He has diabetic nephropathy related to his T1 diabetes.  I had a discussion with nephrology as the patient has had proteinuria.  We will start him on farxiga off FDA label for his CKD.  We will check his urine studies today.

## 2022-09-24 NOTE — Assessment & Plan Note (Signed)
I want him to eat healthy and continue to be active.

## 2022-09-24 NOTE — Assessment & Plan Note (Signed)
He seems to be euthyroid.  We will check his TFT's today.

## 2022-09-24 NOTE — Assessment & Plan Note (Signed)
His BP is under control.  We will continue on his benciar.

## 2022-09-24 NOTE — Assessment & Plan Note (Addendum)
We will continue risk factor modification.  He goes yearly to see vascular surgery.  We will continue with plavix and ECASA.  He has had osteomyelitis of his big toe with amputation in the past on the right.

## 2022-09-24 NOTE — Assessment & Plan Note (Signed)
We will check his FLP where I think his goal LDL probably needs to be <70 with his history of T1 diabetes and PAD.

## 2022-09-24 NOTE — Assessment & Plan Note (Signed)
Health maintenance discussed.  He does not want a colonoscopy and will need a cologuard next year.  We will obtain some yearly labs.

## 2022-09-24 NOTE — Assessment & Plan Note (Signed)
He will followup with optometry and we will make an appointment for his yearly eye exam.

## 2022-09-24 NOTE — Assessment & Plan Note (Signed)
He will continue OTC meds as needed.

## 2022-09-24 NOTE — Assessment & Plan Note (Signed)
Plan as above.  I want him to avoid NSAIDS.

## 2022-10-14 ENCOUNTER — Other Ambulatory Visit: Payer: Self-pay

## 2022-10-14 ENCOUNTER — Ambulatory Visit: Payer: 59 | Admitting: Internal Medicine

## 2022-10-14 DIAGNOSIS — E876 Hypokalemia: Secondary | ICD-10-CM

## 2022-10-14 DIAGNOSIS — I10 Essential (primary) hypertension: Secondary | ICD-10-CM

## 2022-10-14 MED ORDER — CLOPIDOGREL BISULFATE 75 MG PO TABS: 75.0000 mg | ORAL_TABLET | Freq: Every day | ORAL | 1 refills | Status: DC

## 2022-10-14 MED ORDER — POTASSIUM CHLORIDE CRYS ER 20 MEQ PO TBCR
20.0000 meq | EXTENDED_RELEASE_TABLET | Freq: Once | ORAL | Status: DC
Start: 2022-10-14 — End: 2022-10-14

## 2022-10-14 NOTE — Progress Notes (Signed)
Error

## 2022-10-14 NOTE — Addendum Note (Signed)
Addended by: Darci Needle on: 10/14/2022 03:43 PM   Modules accepted: Orders

## 2022-10-14 NOTE — Progress Notes (Signed)
RX Refill 

## 2022-10-14 NOTE — Progress Notes (Signed)
His thyroid meds are too high. Decrease his levothyroine to po daily. His K+ level is elevated- he needs to come back and have this checked on thur or Friday.   Patient is aware

## 2022-10-15 LAB — POTASSIUM: Potassium: 5.2 mmol/L (ref 3.5–5.2)

## 2022-10-21 ENCOUNTER — Other Ambulatory Visit: Payer: Self-pay | Admitting: Internal Medicine

## 2022-10-23 ENCOUNTER — Encounter: Payer: Self-pay | Admitting: Internal Medicine

## 2022-10-23 ENCOUNTER — Ambulatory Visit: Payer: 59

## 2022-10-23 ENCOUNTER — Other Ambulatory Visit: Payer: Self-pay | Admitting: Internal Medicine

## 2022-10-23 ENCOUNTER — Ambulatory Visit: Payer: 59 | Admitting: Internal Medicine

## 2022-10-23 VITALS — BP 122/74 | HR 90 | Temp 98.6°F | Resp 18 | Ht 65.0 in | Wt 118.6 lb

## 2022-10-23 DIAGNOSIS — Z789 Other specified health status: Secondary | ICD-10-CM | POA: Diagnosis not present

## 2022-10-23 DIAGNOSIS — I10 Essential (primary) hypertension: Secondary | ICD-10-CM

## 2022-10-23 DIAGNOSIS — T466X5A Adverse effect of antihyperlipidemic and antiarteriosclerotic drugs, initial encounter: Secondary | ICD-10-CM

## 2022-10-23 DIAGNOSIS — E890 Postprocedural hypothyroidism: Secondary | ICD-10-CM

## 2022-10-23 DIAGNOSIS — E10319 Type 1 diabetes mellitus with unspecified diabetic retinopathy without macular edema: Secondary | ICD-10-CM

## 2022-10-23 DIAGNOSIS — E1021 Type 1 diabetes mellitus with diabetic nephropathy: Secondary | ICD-10-CM

## 2022-10-23 DIAGNOSIS — E782 Mixed hyperlipidemia: Secondary | ICD-10-CM | POA: Diagnosis not present

## 2022-10-23 DIAGNOSIS — N182 Chronic kidney disease, stage 2 (mild): Secondary | ICD-10-CM | POA: Diagnosis not present

## 2022-10-23 DIAGNOSIS — G720 Drug-induced myopathy: Secondary | ICD-10-CM | POA: Diagnosis not present

## 2022-10-23 MED ORDER — CEPHALEXIN 500 MG PO CAPS: 500.0000 mg | ORAL_CAPSULE | Freq: Two times a day (BID) | ORAL | 0 refills | Status: AC

## 2022-10-23 MED ORDER — CLOPIDOGREL BISULFATE 75 MG PO TABS
75.0000 mg | ORAL_TABLET | Freq: Every day | ORAL | 1 refills | Status: DC
Start: 2022-10-23 — End: 2023-05-26

## 2022-10-23 NOTE — Assessment & Plan Note (Signed)
We will recheck a TSH and Free T4 and his HgBA1c in 2-3 weeks.

## 2022-10-23 NOTE — Progress Notes (Signed)
Office Visit  Subjective   Patient ID: Samuel [name]   DOB: August 17, 1965   Age: 57 y.o.   MRN: [id number]   Chief Complaint Chief Complaint  Patient presents with   Acute Visit    Rash     History of Present Illness Samuel [name] is a 57 yo male who returns for a follow-up visit for his Type 1 diabetes.  Over the interim, he states that endocrinology has setup a CGM to act with his insulin pump.  On his last visit, he had a major hypoglycemia event where EMS was called.  Since then, he has not had any further hypoglycemia.   He did see Dr. Allena Katz in [facility] in 07/2022 and they repeated his HgBA1c and it was 8.8%.  He has been seeing [name] who is the diabetic educator at [facility] and they have adjusted his pump and again his CGM is compatible with his insulin pump.   This past year, he had an brasion on the lateral side of his left foot which resolved but he has some new lesions on both feet but these are now resolved as well.  He has had episodes of hypoglycemia in the past that have been associated with not eating.  He remains with an insulin pump using Humalog U-100.   His last HgBA1c was done on 07/2022 and was 8.8%.   The patient is currently using a DEXCOM CMG for blood sugar monitoring but they just changed him to [facility] G7.   His blood sugars on his memory show they usually run 90-400.  He specifically denies unexplained abdominal pain, nausea or vomiting or diarrhea.  He came in fasting today in anticipation of lab work.  He has a history of intolerance to ACE-I due to diarrhea. He has chronic complications of diabetic retinopathy, diabetic neuropathy and diabetic nephropathy with Stage II CKD.  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  We referred him to urology but he never went due to a death in the family.  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 Today, he is now having some "breaking out" with small pimples on his head and some times on his arms.  The patient is a 57 year old Caucasian/White male who returns for a regularly scheduled thyroid check.  On lab testing on 09/24/2022, his TSH was low and Free T4 was high.  I asked him to cut back his levothyroxine.  He was initially diagnosed with hyperthyroidism but he underwent iodine ablation and is now hypothyroidism.  He was on levothyoxine on fri, sat and sun and levothyxoine on Monday- Thur but again I switched him where he is currently on levothryoxine po daily.  He claims to have no symptoms suggestive of thyroid imbalance specifically denying fatigue, cold intolerance, heat intolerance, tremors, anxiety, unexplained weight changes.  The patient is a 57 year old Caucasian/White male who presents for a follow-up evaluation of hypertension. The patient has not been checking his blood pressure at home. The patient's current medications include: Benicar 20 mg tablet daily.  The patient has been tolerating his medications well. The patient denies any headache, visual changes, dizziness, lightheadness, chest pain, shortness of breath, orthopnea,  weakness/numbness, and edema. He reports there have been no other symptoms noted.   Samuel [name], II returns today for routine followup on his cholesterol.  Over the interim, he states his insurance would no longer cover repatha as he is not on a statin.   He has been on Lipitor, crestor, pravastatin, lovastatin and zocor in the past and could not tolerate this.  He quit his welchol due to myalgias.  We tried him on livalo 2mg  daily which he tolerated well.  However his insurance copay was $1000 dollars and he could not afford this medication.  I started him on repatha 140mg  twice monthly which he is  tolerating well.  Overall, he states he is doing well and is without any complaints or problems at this time. He specifically denies chest pain, shortness of breath, abdominal pain, nausea, vomiting, diarrhea, myalgias, and fatigue. He remains on Repatha 140mg  sub twice a month.  He has been on repatha since 03/2018.  He is fasting in anticipation for labs today.     Past Medical History Past Medical History:  Diagnosis Date   CKD (chronic kidney disease), stage II    Diabetic nephropathy associated with type 1 diabetes mellitus (HCC)    Diabetic retinopathy associated with type 1 diabetes mellitus (HCC)    Essential hypertension    Hyperlipidemia    Hyperthyroidism    PAD (peripheral artery disease) (HCC)    Type 1 diabetes (HCC)      Allergies Allergies  Allergen Reactions   Atorvastatin Other (See Comments)    MYALGIAS  Myalgia.   Colesevelam Other (See Comments)    Myalgia.   Ramipril Diarrhea     Medications  Current Outpatient Medications:    aspirin EC 81 MG tablet, Take 81 mg by mouth once., Disp: , Rfl:    clopidogrel (PLAVIX) 75 MG tablet, Take 1 tablet (75 mg total) by mouth daily., Disp: 90 tablet, Rfl: 1   dapagliflozin propanediol (FARXIGA) 10 MG TABS tablet, Take 1 tablet (10 mg total) by mouth daily., Disp: 90 tablet, Rfl: 3   Evolocumab (REPATHA SURECLICK) 140 MG/ML SOAJ, INJECT 140MG  INTO THE SKIN TWICE A MONTH, Disp: 2 mL, Rfl: 11   HUMALOG 100 UNIT/ML cartridge, , Disp: , Rfl:    HUMALOG 100 UNIT/ML injection, USES INSULIN PUMP WITH BASAL RATE 12AM 0.35U/HR UNTIL 6AM THEN 0.4U UNTIL 12AM., Disp: 60 mL, Rfl: 3   latanoprost (XALATAN) 0.005 % ophthalmic solution, Place 1 drop into both eyes at bedtime., Disp: , Rfl:    levothyroxine (SYNTHROID) 100 MCG tablet, TAKE 1 TABLET BY MOUTH DAILY BEFORE BREAKFAST., Disp: 90 tablet, Rfl: 1   levothyroxine (SYNTHROID) 125 MCG tablet, TAKE 1 TABLET BY MOUTH DAILY BEFORE BREAKFAST., Disp: 90 tablet, Rfl: 1   olmesartan  (BENICAR) 20 MG tablet, TAKE 1 TABLET BY MOUTH EVERY DAY, Disp: 90 tablet, Rfl: 1   Review of Systems Review of Systems  Constitutional:  Negative for chills and fever.  Eyes:  Negative for blurred vision and double vision.  Respiratory:  Negative for cough and shortness of breath.   Cardiovascular:  Negative for chest pain, palpitations and leg swelling.  Gastrointestinal:  Negative for abdominal pain, constipation, diarrhea, nausea and vomiting.  Genitourinary:  Positive for frequency.  Musculoskeletal:  Negative for myalgias.  Skin:  Positive for rash. Negative for itching.  Neurological:  Negative for dizziness, weakness and headaches.  Endo/Heme/Allergies:  Negative for polydipsia.       Objective:    Vitals  BP 122/74   Pulse 90   Temp 98.6 F (37 C)   Resp 18   Ht 5\' 5"  (1.651 m)   Wt 118 lb 9.6 oz (53.8 kg)   SpO2 96%   BMI 19.74 kg/m    Physical Examination Physical Exam Constitutional:      Appearance: Normal appearance. He is not ill-appearing.  Cardiovascular:     Rate and Rhythm: Normal rate and regular rhythm.     Pulses: Normal pulses.     Heart sounds: No murmur heard.    No friction rub. No gallop.  Pulmonary:     Effort: Pulmonary effort is normal. No respiratory distress.     Breath sounds: No wheezing, rhonchi or rales.  Abdominal:     General: Abdomen is flat. Bowel sounds are normal. There is no distension.     Palpations: Abdomen is soft.     Tenderness: There is no abdominal tenderness.  Musculoskeletal:     Right lower leg: No edema.     Left lower leg: No edema.  Skin:    General: Skin is warm and dry.     Findings: No rash.  Neurological:     General: No focal deficit present.     Mental Status: He is alert and oriented to person, place, and time.  Psychiatric:        Mood and Affect: Mood normal.        Behavior: Behavior normal.        Assessment & Plan:   Diabetic nephropathy associated with type 1 diabetes mellitus  (HCC) He has diabetic nephropathy related to his T1 diabetes. I had a previous discussion with nephrology as the patient has had proteinuria. We started him on farxiga off FDA label for his CKD.  He goes to see the diabetic educator next month.  We will check his HgBA1c today.  Diabetic retinopathy associated with type 1 diabetes mellitus (HCC) He needs a referral back to see Dr. Precious Bard for his yearly eye exam where he has a history of diabetic retinopathy.  Stage 2 chronic kidney disease He to avoid NSAIDS at this time.  Postablative hypothyroidism We will recheck a TSH and Free T4 and his HgBA1c in 2-3 weeks.  Statin myopathy Plan as above.  Statin intolerance Plan as above.  Mixed hyperlipidemia He has tried multiple statins and this has caused myalgias.  He is currently on repatha.    Return in about 3 months (around 01/23/2023).   Crist Fat, MD

## 2022-10-23 NOTE — Assessment & Plan Note (Signed)
Plan as above.  

## 2022-10-23 NOTE — Assessment & Plan Note (Signed)
Plan as above.

## 2022-10-23 NOTE — Assessment & Plan Note (Signed)
He has diabetic nephropathy related to his T1 diabetes. I had a previous discussion with nephrology as the patient has had proteinuria. We started him on farxiga off FDA label for his CKD.  He goes to see the diabetic educator next month.  We will check his HgBA1c today.

## 2022-10-23 NOTE — Assessment & Plan Note (Signed)
He to avoid NSAIDS at this time.

## 2022-10-23 NOTE — Assessment & Plan Note (Signed)
He has tried multiple statins and this has caused myalgias.  He is currently on repatha.

## 2022-10-23 NOTE — Assessment & Plan Note (Signed)
He needs a referral back to see Dr. Precious Bard for his yearly eye exam where he has a history of diabetic retinopathy.

## 2022-10-24 ENCOUNTER — Other Ambulatory Visit: Payer: Self-pay

## 2022-10-24 DIAGNOSIS — E1021 Type 1 diabetes mellitus with diabetic nephropathy: Secondary | ICD-10-CM

## 2022-10-24 MED ORDER — INSULIN LISPRO 100 UNIT/ML IJ SOLN
INTRAMUSCULAR | 3 refills | Status: AC
Start: 2022-10-24 — End: 2023-10-24

## 2022-10-24 NOTE — Progress Notes (Signed)
Rx Refill Correction

## 2022-11-11 DIAGNOSIS — E1042 Type 1 diabetes mellitus with diabetic polyneuropathy: Secondary | ICD-10-CM | POA: Diagnosis not present

## 2022-11-13 ENCOUNTER — Encounter: Payer: Self-pay | Admitting: Internal Medicine

## 2022-11-13 ENCOUNTER — Ambulatory Visit: Payer: 59

## 2022-11-13 ENCOUNTER — Ambulatory Visit: Payer: 59 | Admitting: Internal Medicine

## 2022-11-13 ENCOUNTER — Other Ambulatory Visit: Payer: 59

## 2022-11-13 VITALS — BP 130/72 | HR 96 | Temp 98.8°F | Resp 18 | Ht 65.0 in | Wt 113.6 lb

## 2022-11-13 DIAGNOSIS — J0100 Acute maxillary sinusitis, unspecified: Secondary | ICD-10-CM | POA: Diagnosis not present

## 2022-11-13 DIAGNOSIS — E1021 Type 1 diabetes mellitus with diabetic nephropathy: Secondary | ICD-10-CM | POA: Diagnosis not present

## 2022-11-13 DIAGNOSIS — E109 Type 1 diabetes mellitus without complications: Secondary | ICD-10-CM | POA: Diagnosis not present

## 2022-11-13 MED ORDER — FLUTICASONE PROPIONATE 50 MCG/ACT NA SUSP: 1.0000 | Freq: Two times a day (BID) | NASAL | 0 refills | Status: DC

## 2022-11-13 MED ORDER — AMOXICILLIN-POT CLAVULANATE 875-125 MG PO TABS: 1.0000 | ORAL_TABLET | Freq: Two times a day (BID) | ORAL | 0 refills | Status: DC

## 2022-11-13 NOTE — Assessment & Plan Note (Signed)
His rapid COVID-19 test was negative.  I think he has sinusitis.  We will start him on some augmentin and flonase nasal spray.  We will continue supportive care.

## 2022-11-13 NOTE — Progress Notes (Signed)
Office Visit  Subjective   Patient ID: Samuel [name]   DOB: 01/24/1965   Age: 57 y.o.   MRN: [id number]   Chief Complaint Chief Complaint  Patient presents with   Acute Visit    Congestion, x7 days.      History of Present Illness The patient is a 57 yo male who comes in today with sinus congestion with brown nasal discharge with productive cough.  He states this started 7 days ago with sinus drainaged that was initially clear with some minor sore throat.  The next day he has worsening sinus congestion with green nasal discharge with post nasal drip.  Over the next few days, this moved into his chest where he has productive cough with some minor wheezing.  Otherwise no fever, chills, headaches, myalgias, nausea, vomiting and he had one day of diarrhea.  There has been no SOB.  The patient has taken equate generic for cold and sinus.  He does have a history of T1 diabetes and seasonal allergies.     Past Medical History Past Medical History:  Diagnosis Date   CKD (chronic kidney disease), stage II    Diabetic nephropathy associated with type 1 diabetes mellitus (HCC)    Diabetic retinopathy associated with type 1 diabetes mellitus (HCC)    Essential hypertension    Hyperlipidemia    Hyperthyroidism    PAD (peripheral artery disease) (HCC)    Type 1 diabetes (HCC)      Allergies Allergies  Allergen Reactions   Atorvastatin Other (See Comments)    MYALGIAS  Myalgia.   Colesevelam Other (See Comments)    Myalgia.   Ramipril Diarrhea     Medications  Current Outpatient Medications:    aspirin EC 81 MG tablet, Take 81 mg by mouth once., Disp: , Rfl:    clopidogrel (PLAVIX) 75 MG tablet, Take 1 tablet (75 mg total) by mouth daily., Disp: 90 tablet, Rfl: 1   dapagliflozin propanediol (FARXIGA) 10 MG TABS tablet, Take 1 tablet (10 mg total) by mouth daily., Disp: 90 tablet, Rfl: 3   Evolocumab (REPATHA SURECLICK) 140 MG/ML SOAJ, INJECT 140MG  INTO THE SKIN TWICE A MONTH,  Disp: 2 mL, Rfl: 11   HUMALOG 100 UNIT/ML cartridge, , Disp: , Rfl:    HUMALOG 100 UNIT/ML injection, USES INSULIN PUMP WITH BASAL RATE 12AM 0.35U/HR UNTIL 6AM THEN 0.4U UNTIL 12AM., Disp: 60 mL, Rfl: 3   insulin lispro (HUMALOG) 100 UNIT/ML injection, .35 ml at 6 a.m, then .40 ml until 12 a.m, Disp: 10 mL, Rfl: 3   latanoprost (XALATAN) 0.005 % ophthalmic solution, Place 1 drop into both eyes at bedtime., Disp: , Rfl:    levothyroxine (SYNTHROID) 100 MCG tablet, TAKE 1 TABLET BY MOUTH DAILY BEFORE BREAKFAST., Disp: 90 tablet, Rfl: 1   levothyroxine (SYNTHROID) 125 MCG tablet, TAKE 1 TABLET BY MOUTH DAILY BEFORE BREAKFAST., Disp: 90 tablet, Rfl: 1   olmesartan (BENICAR) 20 MG tablet, TAKE 1 TABLET BY MOUTH EVERY DAY, Disp: 90 tablet, Rfl: 1   Review of Systems Review of Systems  Constitutional:  Negative for chills and fever.  HENT:  Positive for congestion, sinus pain and sore throat.   Respiratory:  Positive for cough, sputum production and wheezing. Negative for shortness of breath.   Cardiovascular:  Negative for chest pain and leg swelling.  Gastrointestinal:  Negative for abdominal pain, constipation, diarrhea, nausea and vomiting.  Musculoskeletal:  Negative for myalgias.  Neurological:  Negative for dizziness, weakness and headaches.  Objective:    Vitals BP 130/72   Pulse 96   Temp 98.8 F (37.1 C)   Resp 18   Ht 5\' 5"  (1.651 m)   Wt 113 lb 9.6 oz (51.5 kg)   SpO2 95%   BMI 18.90 kg/m    Physical Examination Physical Exam Constitutional:      Appearance: Normal appearance. He is not ill-appearing.  HENT:     Right Ear: Tympanic membrane, ear canal and external ear normal.     Left Ear: Tympanic membrane, ear canal and external ear normal.     Nose: Nose normal. No congestion or rhinorrhea.     Mouth/Throat:     Mouth: Mucous membranes are moist.     Pharynx: Oropharynx is clear. No oropharyngeal exudate or posterior oropharyngeal erythema.  Cardiovascular:      Rate and Rhythm: Normal rate and regular rhythm.     Pulses: Normal pulses.     Heart sounds: No murmur heard.    No friction rub. No gallop.  Pulmonary:     Effort: Pulmonary effort is normal. No respiratory distress.     Breath sounds: No wheezing, rhonchi or rales.  Abdominal:     General: Abdomen is flat. Bowel sounds are normal. There is no distension.     Palpations: Abdomen is soft.     Tenderness: There is no abdominal tenderness.  Musculoskeletal:     Right lower leg: No edema.     Left lower leg: No edema.  Skin:    General: Skin is warm and dry.     Findings: No rash.  Neurological:     Mental Status: He is alert.        Assessment & Plan:   Acute non-recurrent maxillary sinusitis His rapid COVID-19 test was negative.  I think he has sinusitis.  We will start him on some augmentin and flonase nasal spray.  We will continue supportive care.    No follow-ups on file.   Crist Fat, MD

## 2022-11-13 NOTE — Addendum Note (Signed)
Addended by: Darci Needle on: 11/13/2022 03:43 PM   Modules accepted: Orders

## 2022-11-14 LAB — HEMOGLOBIN A1C
Est. average glucose Bld gHb Est-mCnc: 171 mg/dL
Hgb A1c MFr Bld: 7.6 % — ABNORMAL HIGH (ref 4.8–5.6)

## 2022-11-14 LAB — TSH: TSH: 0.061 u[IU]/mL — ABNORMAL LOW (ref 0.450–4.500)

## 2022-11-14 LAB — T4, FREE: Free T4: 1.89 ng/dL — ABNORMAL HIGH (ref 0.82–1.77)

## 2022-11-15 ENCOUNTER — Ambulatory Visit: Payer: 59

## 2022-12-10 ENCOUNTER — Other Ambulatory Visit: Payer: Self-pay | Admitting: Internal Medicine

## 2022-12-13 DIAGNOSIS — E785 Hyperlipidemia, unspecified: Secondary | ICD-10-CM | POA: Diagnosis not present

## 2022-12-13 DIAGNOSIS — E119 Type 2 diabetes mellitus without complications: Secondary | ICD-10-CM | POA: Diagnosis not present

## 2022-12-13 DIAGNOSIS — I739 Peripheral vascular disease, unspecified: Secondary | ICD-10-CM | POA: Diagnosis not present

## 2022-12-13 DIAGNOSIS — Z87891 Personal history of nicotine dependence: Secondary | ICD-10-CM | POA: Diagnosis not present

## 2022-12-13 DIAGNOSIS — I10 Essential (primary) hypertension: Secondary | ICD-10-CM | POA: Diagnosis not present

## 2022-12-13 DIAGNOSIS — E782 Mixed hyperlipidemia: Secondary | ICD-10-CM | POA: Diagnosis not present

## 2022-12-23 ENCOUNTER — Ambulatory Visit: Payer: 59 | Admitting: Internal Medicine

## 2022-12-23 ENCOUNTER — Encounter: Payer: Self-pay | Admitting: Internal Medicine

## 2022-12-23 VITALS — BP 122/64 | HR 84 | Temp 98.1°F | Resp 18 | Ht 65.0 in | Wt 116.2 lb

## 2022-12-23 DIAGNOSIS — T466X5A Adverse effect of antihyperlipidemic and antiarteriosclerotic drugs, initial encounter: Secondary | ICD-10-CM | POA: Diagnosis not present

## 2022-12-23 DIAGNOSIS — I10 Essential (primary) hypertension: Secondary | ICD-10-CM

## 2022-12-23 DIAGNOSIS — E1042 Type 1 diabetes mellitus with diabetic polyneuropathy: Secondary | ICD-10-CM | POA: Diagnosis not present

## 2022-12-23 DIAGNOSIS — E890 Postprocedural hypothyroidism: Secondary | ICD-10-CM

## 2022-12-23 DIAGNOSIS — G720 Drug-induced myopathy: Secondary | ICD-10-CM

## 2022-12-23 DIAGNOSIS — I739 Peripheral vascular disease, unspecified: Secondary | ICD-10-CM

## 2022-12-23 DIAGNOSIS — Z789 Other specified health status: Secondary | ICD-10-CM

## 2022-12-23 DIAGNOSIS — E782 Mixed hyperlipidemia: Secondary | ICD-10-CM | POA: Diagnosis not present

## 2022-12-23 MED ORDER — EZETIMIBE 10 MG PO TABS: 10.0000 mg | ORAL_TABLET | Freq: Every day | ORAL | 11 refills | Status: DC

## 2022-12-23 NOTE — Assessment & Plan Note (Signed)
Plan as below.

## 2022-12-23 NOTE — Assessment & Plan Note (Signed)
His goal LDL should be less than 55 with his history of T1 diabetes and his PAD.  He has statin myopathy/intolerance where he is on repatha at this time.  I am going to add zetia for better cholesterol control.  We will check his FLP on his next visit.

## 2022-12-23 NOTE — Assessment & Plan Note (Signed)
His BP is controlled.  We will continue to monitor.  Continue on benicar.

## 2022-12-23 NOTE — Progress Notes (Signed)
Office Visit  Subjective   Patient ID: Samuel [name]   DOB: Aug 11, 1965   Age: 57 y.o.   MRN: [id number]   Chief Complaint Chief Complaint  Patient presents with   Follow-up    Follow up on DM, please review Levothyroxine useage     History of Present Illness Samuel [name] is a 57 yo male who returns for a follow-up visit for his Type 1 diabetes.  Over the interim, he has not had any problems.   He did see Dr. Allena Katz in [facility] in 07/2022 and they repeated his HgBA1c and it was 8.8%.  He has been seeing [name] who is the diabetic educator at [facility] and they have adjusted his pump and again his CGM is compatible with his insulin pump.   This past year, he had an brasion on the lateral side of his left foot which resolved but he has some new lesions on both feet but these are now resolved as well.  He has had episodes of hypoglycemia in the past that have been associated with not eating.  He remains with an insulin pump using Humalog U-100.   His last HgBA1c was done on 11/13/2022 and was 7.6%.   The patient is currently using a DEXCOM G7 CMG for blood sugar monitoring.   His blood sugars on his memory show they usually run 90-400.  He specifically denies unexplained abdominal pain, nausea or vomiting or diarrhea.  He came in fasting today in anticipation of lab work.  He has a history of intolerance to ACE-I due to diarrhea. He has chronic complications of diabetic retinopathy, diabetic neuropathy and diabetic nephropathy with Stage Samuel [name] CKD.  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  We referred him to urology but he never went due to a death in the family.  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 He has an appointment this coming  Wednesday for his yearly eye exam.  The patient is a 57 year old Caucasian/White male who returns for a regularly scheduled thyroid check.  Over the last several months we have been adjusting his levothyroxine as his TSH has been low and his Free T4 has been high.  We contacted him to take levothyroxine daily except he needs to skip his dose on Sunday.  However, today he tells me he has not been doing this yet for 6-8 weeks.    He was initially diagnosed with hyperthyroidism but he underwent iodine ablation and is now hypothyroidism.  He was on levothyoxine on fri, sat and sun and levothyxoine on Monday- Thur but again I switched him where he is currently on levothryoxine po daily.  He claims to have no symptoms suggestive of thyroid imbalance specifically denying fatigue, cold intolerance, heat intolerance, tremors, anxiety, unexplained weight changes.  Samuel [name], Samuel [name] returns today for routine followup on his cholesterol.  Over the interim, he remains on repatha.   He has been on Lipitor, crestor, pravastatin, lovastatin and zocor in the past and could not tolerate this.  He quit his welchol due to myalgias.  We tried him on livalo 2mg  daily which he tolerated well.  However his insurance copay was $1000  dollars and he could not afford this medication.  I started him on repatha 140mg  twice monthly which he is tolerating well.  Overall, he states he is doing well and is without any complaints or problems at this time. He specifically denies chest pain, shortness of breath, abdominal pain, nausea, vomiting, diarrhea, myalgias, and fatigue. He remains on Repatha 140mg  sub twice a month.  He has been on repatha since 03/2018.  He is fasting in anticipation for labs today.  The patient also has a history of peripheral arterial disease.  The patient has had a diabetic foot ulcer in the past of his right big toe in 2021.  We referred him to wound care where they performed a workup  and discovered on MRI that he had osteomyelitis of his right big toe.  The patient completed a 6 week course of antibiotics.  They did a vascular workup where he had an abnormal ABI of 0.64 on the right.  Her underwent pelvic and lower extremity arteriogram and it was discovered that he has PAD where he underwent revascularization of an occluded segment of his left superficial femoral artery on 08/30/2019.  He had combine atherectomy and balloon angioplasty of the right superficial femoral artery with single vessel runoff to the foot in the form of a hypertrophic peroneal artery.  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  He went just last week for combined atherectomy and balloon angioplasty where he had successful revascularization of his anterior tibial artery.  His wound slowly healed and he completed a course of hyperbaric oxygen treatments.  They performed an ECHO on 10/2015 and this was normal with an EF of 55%.  He is followed by vascular yearly with his last visit around 12/2021 where he states they have been doing arterial US's of his legs.  They want to continue medical management and discussed a dedicated walking program program to build collateral flow.  He remains on Plavix daily and ECASA 81mg  daily.     Past Medical History Past Medical History:  Diagnosis Date   CKD (chronic kidney disease), stage Samuel [name]    Diabetic nephropathy associated with type 1 diabetes mellitus (HCC)    Diabetic retinopathy associated with type 1 diabetes mellitus (HCC)    Essential hypertension    Hyperlipidemia    Hyperthyroidism    PAD (peripheral artery disease) (HCC)    Type 1 diabetes (HCC)      Allergies Allergies  Allergen Reactions   Atorvastatin Other (See Comments)    MYALGIAS  Myalgia.   Colesevelam Other (See Comments)    Myalgia.   Ramipril Diarrhea     Medications  Current Outpatient Medications:    ezetimibe (ZETIA) 10 MG tablet, Take 1 tablet (10  mg total) by mouth daily., Disp: 30 tablet, Rfl: 11   aspirin EC 81 MG tablet, Take 81 mg by mouth once., Disp: , Rfl:    clopidogrel (PLAVIX) 75 MG tablet, Take 1 tablet (75 mg total) by mouth daily., Disp: 90 tablet, Rfl: 1   dapagliflozin propanediol (FARXIGA) 10 MG TABS tablet, Take 1 tablet (10 mg total) by mouth daily., Disp: 90 tablet, Rfl: 3   Evolocumab (REPATHA SURECLICK) 140 MG/ML SOAJ, INJECT 140MG  INTO THE SKIN TWICE A MONTH, Disp: 2 mL, Rfl: 11   fluticasone (FLONASE) 50 MCG/ACT nasal spray, PLACE 1 SPRAY INTO BOTH NOSTRILS IN THE MORNING AND AT BEDTIME., Disp: 16 mL, Rfl: 0   HUMALOG 100 UNIT/ML cartridge, , Disp: , Rfl:  HUMALOG 100 UNIT/ML injection, USES INSULIN PUMP WITH BASAL RATE 12AM 0.35U/HR UNTIL 6AM THEN 0.4U UNTIL 12AM., Disp: 60 mL, Rfl: 3   insulin lispro (HUMALOG) 100 UNIT/ML injection, .35 ml at 6 a.m, then .40 ml until 12 a.m, Disp: 10 mL, Rfl: 3   latanoprost (XALATAN) 0.005 % ophthalmic solution, Place 1 drop into both eyes at bedtime., Disp: , Rfl:    levothyroxine (SYNTHROID) 100 MCG tablet, TAKE 1 TABLET BY MOUTH DAILY BEFORE BREAKFAST., Disp: 90 tablet, Rfl: 1   levothyroxine (SYNTHROID) 125 MCG tablet, TAKE 1 TABLET BY MOUTH DAILY BEFORE BREAKFAST., Disp: 90 tablet, Rfl: 1   olmesartan (BENICAR) 20 MG tablet, TAKE 1 TABLET BY MOUTH EVERY DAY, Disp: 90 tablet, Rfl: 1   Review of Systems ROS     Objective:    Vitals BP 122/64 (BP Location: Left Arm)   Pulse 84   Temp 98.1 F (36.7 C) (Temporal)   Resp 18   Ht 5\' 5"  (1.651 m)   Wt 116 lb 4 oz (52.7 kg)   SpO2 98%   BMI 19.35 kg/m    Physical Examination Physical Exam     Assessment & Plan:   PAD (peripheral artery disease) (HCC) I want him to continue on ASA and plavix with continued risk factor modification.  He has seen vascular surgery and there was no progression of disease and they will see him back in 1 year.  Essential hypertension His BP is controlled.  We will continue to  monitor.  Continue on benicar.  Diabetic polyneuropathy associated with type 1 diabetes mellitus (HCC) He will continue on his current pump settings and followup with endocrinology as directed.  I will check his HgBa1c on his next visit.  Statin myopathy Plan as below.  Statin intolerance Plan as below.  Mixed hyperlipidemia His goal LDL should be less than 55 with his history of T1 diabetes and his PAD.  He has statin myopathy/intolerance where he is on repatha at this time.  I am going to add zetia for better cholesterol control.  We will check his FLP on his next visit.    Return in about 3 months (around 03/23/2023).   Crist Fat, MD

## 2022-12-23 NOTE — Assessment & Plan Note (Addendum)
I want him to continue on ASA and plavix with continued risk factor modification.  He has seen vascular surgery and there was no progression of disease and they will see him back in 1 year.

## 2022-12-23 NOTE — Assessment & Plan Note (Signed)
He will continue on his current pump settings and followup with endocrinology as directed.  I will check his HgBa1c on his next visit.

## 2022-12-25 DIAGNOSIS — E103593 Type 1 diabetes mellitus with proliferative diabetic retinopathy without macular edema, bilateral: Secondary | ICD-10-CM | POA: Diagnosis not present

## 2022-12-25 DIAGNOSIS — H04123 Dry eye syndrome of bilateral lacrimal glands: Secondary | ICD-10-CM | POA: Diagnosis not present

## 2022-12-25 DIAGNOSIS — H52223 Regular astigmatism, bilateral: Secondary | ICD-10-CM | POA: Diagnosis not present

## 2022-12-25 DIAGNOSIS — H35372 Puckering of macula, left eye: Secondary | ICD-10-CM | POA: Diagnosis not present

## 2023-01-06 ENCOUNTER — Other Ambulatory Visit: Payer: Self-pay | Admitting: Internal Medicine

## 2023-01-16 DIAGNOSIS — Z9641 Presence of insulin pump (external) (internal): Secondary | ICD-10-CM | POA: Diagnosis not present

## 2023-01-16 DIAGNOSIS — E1042 Type 1 diabetes mellitus with diabetic polyneuropathy: Secondary | ICD-10-CM | POA: Diagnosis not present

## 2023-01-16 DIAGNOSIS — Z978 Presence of other specified devices: Secondary | ICD-10-CM | POA: Diagnosis not present

## 2023-01-16 DIAGNOSIS — E890 Postprocedural hypothyroidism: Secondary | ICD-10-CM | POA: Diagnosis not present

## 2023-01-16 DIAGNOSIS — E782 Mixed hyperlipidemia: Secondary | ICD-10-CM | POA: Diagnosis not present

## 2023-01-23 ENCOUNTER — Other Ambulatory Visit: Payer: Self-pay | Admitting: Internal Medicine

## 2023-02-05 ENCOUNTER — Other Ambulatory Visit: Payer: Self-pay | Admitting: Internal Medicine

## 2023-02-19 DIAGNOSIS — E109 Type 1 diabetes mellitus without complications: Secondary | ICD-10-CM | POA: Diagnosis not present

## 2023-03-05 DIAGNOSIS — E1042 Type 1 diabetes mellitus with diabetic polyneuropathy: Secondary | ICD-10-CM | POA: Diagnosis not present

## 2023-03-18 DIAGNOSIS — E1042 Type 1 diabetes mellitus with diabetic polyneuropathy: Secondary | ICD-10-CM | POA: Diagnosis not present

## 2023-03-21 ENCOUNTER — Encounter: Payer: Self-pay | Admitting: Internal Medicine

## 2023-03-21 ENCOUNTER — Ambulatory Visit: Payer: 59 | Admitting: Internal Medicine

## 2023-03-21 VITALS — BP 114/64 | HR 88 | Temp 98.7°F | Resp 18 | Ht 65.0 in | Wt 113.2 lb

## 2023-03-21 DIAGNOSIS — E890 Postprocedural hypothyroidism: Secondary | ICD-10-CM | POA: Diagnosis not present

## 2023-03-21 DIAGNOSIS — I739 Peripheral vascular disease, unspecified: Secondary | ICD-10-CM

## 2023-03-21 DIAGNOSIS — G720 Drug-induced myopathy: Secondary | ICD-10-CM

## 2023-03-21 DIAGNOSIS — T466X5A Adverse effect of antihyperlipidemic and antiarteriosclerotic drugs, initial encounter: Secondary | ICD-10-CM | POA: Diagnosis not present

## 2023-03-21 DIAGNOSIS — Z789 Other specified health status: Secondary | ICD-10-CM | POA: Diagnosis not present

## 2023-03-21 DIAGNOSIS — E1042 Type 1 diabetes mellitus with diabetic polyneuropathy: Secondary | ICD-10-CM | POA: Diagnosis not present

## 2023-03-21 DIAGNOSIS — E782 Mixed hyperlipidemia: Secondary | ICD-10-CM

## 2023-03-21 NOTE — Progress Notes (Signed)
 Office Visit  Subjective   Patient ID: Samuel [name]   DOB: 1965-12-21   Age: 58 y.o.   MRN: [id number]   Chief Complaint Chief Complaint  Patient presents with   Follow-up     History of Present Illness Samuel [name] is a 58 yo male who returns for a follow-up visit for his Type 1 diabetes.  Over the interim, he did see Dr. Allena Katz on 01/16/2023.  He also has been seeing [name] with diabetic counsellors and they have been adjusting his pump.  He was having some episodes of hypoglycemia but over the last month with their adjustments he may have rare hypoglycemia.  This past year, he had an abrasion on the lateral side of his left foot which resolved but he has some new lesions on both feet but these are now resolved as well.  He has had episodes of hypoglycemia in the past that have been associated with not eating.  He remains with an insulin pump using Humalog U-100.   His last HgBA1c was done on 01/16/2023 and was 8%.   The patient is currently using a DEXCOM G7 CMG for blood sugar monitoring.   His blood sugars on his memory show his average is 185.  He specifically denies unexplained abdominal pain, nausea or vomiting or diarrhea.  He came in fasting today in anticipation of lab work.  He has a history of intolerance to ACE-I due to diarrhea. He has chronic complications of diabetic retinopathy, diabetic neuropathy and diabetic nephropathy with Stage [name] CKD.  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  We referred him to urology but he never went due to a death in the family.  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 He has an appointment this coming Wednesday for his yearly eye exam.   The patient is a 57 year old  Caucasian/White male who returns for a regularly scheduled thyroid check.  On his last visit, his Free T4 was elevated and I asked Samuel [name] to continue his levothyroxine daily except he skip his dose on Sunday.  He was initially diagnosed with hyperthyroidism but he underwent iodine ablation and is now hypothyroidism.   He claims to have no symptoms suggestive of thyroid imbalance specifically denying fatigue, cold intolerance, heat intolerance, tremors, anxiety, unexplained weight changes.   Samuel [name] returns today for routine followup on his cholesterol.  On his last visit, I tried to start him on zetia.  He remains on repatha as well.  However, the zetia caused him to have myalgias and stopped this as well.  He has been on Lipitor, crestor, pravastatin, lovastatin and zocor in the past and could not tolerate this.  He quit his welchol due to myalgias.  We tried him on livalo 2mg  daily which he tolerated well.  However his insurance copay was $1000 dollars and he could not afford this medication.  I started him on repatha 140mg  twice monthly which he is tolerating well.  Overall, he states he is doing well and is without any complaints or problems at this time. He specifically denies chest pain, shortness of breath, abdominal pain, nausea, vomiting, diarrhea, myalgias, and fatigue. He remains on  Repatha 140mg  sub twice a month.  He has been on repatha since 03/2018.  He is fasting in anticipation for labs today.   The patient also has a history of peripheral arterial disease.  The patient has had a diabetic foot ulcer in the past of his right big toe in 2021.  We referred him to wound care where they performed a workup and discovered on MRI that he had osteomyelitis of his right big toe.  The patient completed a 6 week course of antibiotics.  They did a vascular workup where he had an abnormal ABI of 0.64 on the right.  Her underwent pelvic and lower extremity arteriogram and it was  discovered that he has PAD where he underwent revascularization of an occluded segment of his left superficial femoral artery on 08/30/2019.  He had combine atherectomy and balloon angioplasty of the right superficial femoral artery with single vessel runoff to the foot in the form of a hypertrophic peroneal artery.  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  He went just last week for combined atherectomy and balloon angioplasty where he had successful revascularization of his anterior tibial artery.  His wound slowly healed and he completed a course of hyperbaric oxygen treatments.  They performed an ECHO on 10/2015 and this was normal with an EF of 55%.  He is followed by vascular yearly with his last visit around 12/2021 where he states they have been doing arterial US's of his legs.  They want to continue medical management and discussed a dedicated walking program program to build collateral flow.  He remains on Plavix daily and ECASA 81mg  daily.      Past Medical History Past Medical History:  Diagnosis Date   CKD (chronic kidney disease), stage [name]    Diabetic nephropathy associated with type 1 diabetes mellitus (HCC)    Diabetic retinopathy associated with type 1 diabetes mellitus (HCC)    Essential hypertension    Hyperlipidemia    Hyperthyroidism    PAD (peripheral artery disease) (HCC)    Type 1 diabetes (HCC)      Allergies Allergies  Allergen Reactions   Atorvastatin Other (See Comments)    MYALGIAS  Myalgia.   Colesevelam Other (See Comments)    Myalgia.   Ramipril Diarrhea     Medications  Current Outpatient Medications:    aspirin EC 81 MG tablet, Take 81 mg by mouth once., Disp: , Rfl:    clopidogrel (PLAVIX) 75 MG tablet, Take 1 tablet (75 mg total) by mouth daily., Disp: 90 tablet, Rfl: 1   dapagliflozin propanediol (FARXIGA) 10 MG TABS tablet, Take 1 tablet (10 mg total) by mouth daily., Disp: 90 tablet, Rfl: 3   Evolocumab  (REPATHA SURECLICK) 140 MG/ML SOAJ, INJECT 140MG  INTO THE SKIN TWICE A MONTH, Disp: 6 mL, Rfl: 3   ezetimibe (ZETIA) 10 MG tablet, Take 1 tablet (10 mg total) by mouth daily., Disp: 30 tablet, Rfl: 11   fluticasone (FLONASE) 50 MCG/ACT nasal spray, PLACE 1 SPRAY INTO BOTH NOSTRILS IN THE MORNING AND AT BEDTIME., Disp: 16 mL, Rfl: 0   HUMALOG 100 UNIT/ML cartridge, , Disp: , Rfl:    HUMALOG 100 UNIT/ML injection, USES INSULIN PUMP WITH BASAL RATE 12AM 0.35U/HR UNTIL 6AM THEN 0.4U UNTIL 12AM., Disp: 60 mL, Rfl: 3   insulin lispro (HUMALOG) 100 UNIT/ML injection, .35 ml at 6 a.m, then .40 ml until 12 a.m, Disp: 10 mL, Rfl: 3   latanoprost (XALATAN) 0.005 % ophthalmic  solution, Place 1 drop into both eyes at bedtime., Disp: , Rfl:    levothyroxine (SYNTHROID) 100 MCG tablet, TAKE 1 TABLET BY MOUTH DAILY BEFORE BREAKFAST., Disp: 90 tablet, Rfl: 1   olmesartan (BENICAR) 20 MG tablet, TAKE 1 TABLET BY MOUTH EVERY DAY, Disp: 90 tablet, Rfl: 1   Review of Systems Review of Systems  Constitutional:  Negative for chills, fever and malaise/fatigue.  Eyes:  Negative for blurred vision and double vision.  Respiratory:  Negative for cough and shortness of breath.   Cardiovascular:  Negative for chest pain, palpitations and leg swelling.  Gastrointestinal:  Negative for abdominal pain, constipation, diarrhea, nausea and vomiting.  Genitourinary:  Negative for frequency.  Musculoskeletal:  Negative for myalgias.  Skin:  Negative for itching and rash.  Neurological:  Negative for dizziness, weakness and headaches.  Endo/Heme/Allergies:  Negative for polydipsia.       Objective:    Vitals BP 114/64   Pulse 88   Temp 98.7 F (37.1 C)   Resp 18   Ht 5\' 5"  (1.651 m)   Wt 113 lb 3.2 oz (51.3 kg)   SpO2 97%   BMI 18.84 kg/m    Physical Examination Physical Exam Constitutional:      Appearance: Normal appearance. He is not ill-appearing.  Cardiovascular:     Rate and Rhythm: Normal rate and  regular rhythm.     Pulses: Normal pulses.     Heart sounds: No murmur heard.    No friction rub. No gallop.  Pulmonary:     Effort: Pulmonary effort is normal. No respiratory distress.     Breath sounds: No wheezing, rhonchi or rales.  Abdominal:     General: Abdomen is flat. Bowel sounds are normal. There is no distension.     Palpations: Abdomen is soft.     Tenderness: There is no abdominal tenderness.  Musculoskeletal:     Right lower leg: No edema.     Left lower leg: No edema.  Skin:    General: Skin is warm and dry.     Findings: No rash.  Neurological:     General: No focal deficit present.     Mental Status: He is alert and oriented to person, place, and time.  Psychiatric:        Mood and Affect: Mood normal.        Behavior: Behavior normal.        Assessment & Plan:   PAD (peripheral artery disease) (HCC) I want him to continue on ASA and plavix with continued risk factor modification.  He has seen vascular surgery and there was no progression of disease and they will see him back in 1 year.  He has a statin intolerance and we are only able to treat him with repatha.  Diabetic polyneuropathy associated with type 1 diabetes mellitus (HCC) His HgBa1c was done 2 months ago and was 8%.  Since then he has had his insulin pumped adjusted and his CGM is estimating at this time his A1c is 7.7%.  We will check his A1c on his next visit.  He will continue with his monitoring and monitoring for hypoglycemia.  He does have a G-Voke at home as needed for emergencies.  Postablative hypothyroidism We are going to check his TFT's again today and adjust as necessary.  Statin myopathy Plan as below.  Statin intolerance Plan as above.  Mixed hyperlipidemia We will recheck his FLP today.  He has statin intolerance/statin myopathy and cannot take  zetia as well.  He remains on repatha at this time.    Return in about 3 months (around 06/21/2023).   Crist Fat, MD

## 2023-03-21 NOTE — Assessment & Plan Note (Signed)
 Plan as above.

## 2023-03-21 NOTE — Assessment & Plan Note (Signed)
 We will recheck his FLP today.  He has statin intolerance/statin myopathy and cannot take zetia as well.  He remains on repatha at this time.

## 2023-03-21 NOTE — Assessment & Plan Note (Addendum)
 His HgBa1c was done 2 months ago and was 8%.  Since then he has had his insulin pumped adjusted and his CGM is estimating at this time his A1c is 7.7%.  We will check his A1c on his next visit.  He will continue with his monitoring and monitoring for hypoglycemia.  He does have a G-Voke at home as needed for emergencies.

## 2023-03-21 NOTE — Assessment & Plan Note (Signed)
 Plan as below.

## 2023-03-21 NOTE — Assessment & Plan Note (Signed)
 We are going to check his TFT's again today and adjust as necessary.

## 2023-03-21 NOTE — Assessment & Plan Note (Signed)
 I want him to continue on ASA and plavix with continued risk factor modification.  He has seen vascular surgery and there was no progression of disease and they will see him back in 1 year.  He has a statin intolerance and we are only able to treat him with repatha.

## 2023-03-22 LAB — CMP14 + ANION GAP
ALT: 19 IU/L (ref 0–44)
AST: 18 IU/L (ref 0–40)
Albumin: 4.7 g/dL (ref 3.8–4.9)
Alkaline Phosphatase: 103 IU/L (ref 44–121)
Anion Gap: 18 mmol/L (ref 10.0–18.0)
BUN/Creatinine Ratio: 24 — ABNORMAL HIGH (ref 9–20)
BUN: 32 mg/dL — ABNORMAL HIGH (ref 6–24)
Bilirubin Total: 0.4 mg/dL (ref 0.0–1.2)
CO2: 21 mmol/L (ref 20–29)
Calcium: 9.5 mg/dL (ref 8.7–10.2)
Chloride: 105 mmol/L (ref 96–106)
Creatinine, Ser: 1.35 mg/dL — ABNORMAL HIGH (ref 0.76–1.27)
Globulin, Total: 2.4 g/dL (ref 1.5–4.5)
Sodium: 144 mmol/L (ref 134–144)
Total Protein: 7.1 g/dL (ref 6.0–8.5)
eGFR: 61 mL/min/{1.73_m2} (ref >59)

## 2023-03-22 LAB — TSH: TSH: 0.094 u[IU]/mL — ABNORMAL LOW (ref 0.450–4.500)

## 2023-03-22 LAB — LIPID PANEL
Chol/HDL Ratio: 3 ratio (ref 0.0–5.0)
Cholesterol, Total: 168 mg/dL (ref 100–199)
HDL: 56 mg/dL (ref >39)
LDL Chol Calc (NIH): 95 mg/dL (ref 0–99)
Triglycerides: 95 mg/dL (ref 0–149)
VLDL Cholesterol Cal: 17 mg/dL (ref 5–40)

## 2023-03-22 LAB — T4, FREE: Free T4: 1.91 ng/dL — ABNORMAL HIGH (ref 0.82–1.77)

## 2023-03-27 ENCOUNTER — Other Ambulatory Visit: Payer: Self-pay

## 2023-03-27 MED ORDER — LEVOTHYROXINE SODIUM 75 MCG PO TABS: 75.0000 ug | ORAL_TABLET | Freq: Every day | ORAL | 2 refills | Status: DC

## 2023-03-28 NOTE — Progress Notes (Signed)
 His labs look good but his thyroid is still too high. I want him to cut his levothyroxine down to po daily. His cholesterol needs to be better controlled but he is on repatha at this time.  Patient aware of labs. Med sent to pharmacy

## 2023-04-21 DIAGNOSIS — E1042 Type 1 diabetes mellitus with diabetic polyneuropathy: Secondary | ICD-10-CM | POA: Diagnosis not present

## 2023-04-24 ENCOUNTER — Encounter: Payer: Self-pay | Admitting: Internal Medicine

## 2023-04-24 ENCOUNTER — Ambulatory Visit: Admitting: Internal Medicine

## 2023-05-05 ENCOUNTER — Encounter: Payer: Self-pay | Admitting: Internal Medicine

## 2023-05-05 ENCOUNTER — Ambulatory Visit: Admitting: Internal Medicine

## 2023-05-08 ENCOUNTER — Other Ambulatory Visit: Payer: Self-pay | Admitting: Internal Medicine

## 2023-05-09 ENCOUNTER — Other Ambulatory Visit: Payer: Self-pay

## 2023-05-09 MED ORDER — OLMESARTAN MEDOXOMIL 20 MG PO TABS: 20.0000 mg | ORAL_TABLET | Freq: Every day | ORAL | 1 refills | Status: DC

## 2023-05-09 NOTE — Progress Notes (Signed)
 Rx Refill

## 2023-05-17 DIAGNOSIS — E109 Type 1 diabetes mellitus without complications: Secondary | ICD-10-CM | POA: Diagnosis not present

## 2023-05-25 ENCOUNTER — Other Ambulatory Visit: Payer: Self-pay | Admitting: Internal Medicine

## 2023-05-25 DIAGNOSIS — I10 Essential (primary) hypertension: Secondary | ICD-10-CM

## 2023-06-05 DIAGNOSIS — E890 Postprocedural hypothyroidism: Secondary | ICD-10-CM | POA: Diagnosis not present

## 2023-06-05 DIAGNOSIS — E1042 Type 1 diabetes mellitus with diabetic polyneuropathy: Secondary | ICD-10-CM | POA: Diagnosis not present

## 2023-06-05 DIAGNOSIS — Z9641 Presence of insulin pump (external) (internal): Secondary | ICD-10-CM | POA: Diagnosis not present

## 2023-06-05 DIAGNOSIS — E782 Mixed hyperlipidemia: Secondary | ICD-10-CM | POA: Diagnosis not present

## 2023-06-05 DIAGNOSIS — Z978 Presence of other specified devices: Secondary | ICD-10-CM | POA: Diagnosis not present

## 2023-06-20 ENCOUNTER — Ambulatory Visit: Admitting: Internal Medicine

## 2023-06-20 ENCOUNTER — Encounter: Payer: Self-pay | Admitting: Internal Medicine

## 2023-06-20 VITALS — BP 120/62 | HR 88 | Temp 98.0°F | Resp 18 | Ht 64.0 in | Wt 113.6 lb

## 2023-06-20 DIAGNOSIS — E1021 Type 1 diabetes mellitus with diabetic nephropathy: Secondary | ICD-10-CM | POA: Diagnosis not present

## 2023-06-20 DIAGNOSIS — Z789 Other specified health status: Secondary | ICD-10-CM

## 2023-06-20 DIAGNOSIS — N182 Chronic kidney disease, stage 2 (mild): Secondary | ICD-10-CM

## 2023-06-20 DIAGNOSIS — E1042 Type 1 diabetes mellitus with diabetic polyneuropathy: Secondary | ICD-10-CM

## 2023-06-20 DIAGNOSIS — T466X5A Adverse effect of antihyperlipidemic and antiarteriosclerotic drugs, initial encounter: Secondary | ICD-10-CM

## 2023-06-20 DIAGNOSIS — E782 Mixed hyperlipidemia: Secondary | ICD-10-CM | POA: Diagnosis not present

## 2023-06-20 DIAGNOSIS — G720 Drug-induced myopathy: Secondary | ICD-10-CM | POA: Diagnosis not present

## 2023-06-20 DIAGNOSIS — I10 Essential (primary) hypertension: Secondary | ICD-10-CM | POA: Diagnosis not present

## 2023-06-20 DIAGNOSIS — E890 Postprocedural hypothyroidism: Secondary | ICD-10-CM

## 2023-06-20 NOTE — Assessment & Plan Note (Signed)
 Plan as above.

## 2023-06-20 NOTE — Progress Notes (Signed)
 Office Visit  Subjective   Patient ID: Samuel [name]   DOB: 03-21-1965   Age: 58 y.o.   MRN: [id number]   Chief Complaint Chief Complaint  Patient presents with   Follow-up    56m f/u     History of Present Illness Mr. Samuel [name] is a 58 yo male who returns for a follow-up visit for his Type 1 diabetes.  Over the interim, he did see Dr. Lydia Sams on 06/05/2023 and he has order him a new insulin  pump (he is waiting for this to be mailed to him).  He also saw the diabetic educator on 04/21/2023 where they noted his CGM showed that his blood sugars were widely fluctuating between 50s to 350s range. Average blood sugars were 178. 56% of the readings are within target range. Patient has slight postprandial hyperglycemia after lunch. Patient has some hypoglycemia episodes with blood sugars dropping to 50s to 60s range usually in the nighttime. They provided the patient with appropriate dietary education based on the CGM findings.  He remains with an insulin  pump using Humalog  U-100.   He is also on farxiga  10mg  daily for his history of diabetic nephropathy  His last HgBA1c was done on 06/05/2023 and was 8.3%.   The patient is currently using a DEXCOM G7 CMG for blood sugar monitoring.   He specifically denies unexplained abdominal pain, nausea or vomiting or diarrhea.  He came in fasting today in anticipation of lab work.  He has a history of intolerance to ACE-I due to diarrhea. He has chronic complications of diabetic retinopathy, diabetic neuropathy and diabetic nephropathy with Stage II CKD.  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  We referred him to urology but he never went due to a death in the family.  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   The patient is a 58 year old Caucasian/White male who presents for a follow-up evaluation of hypertension. The patient has not been checking his blood pressure at home. The patient's current medications include: Benicar  20 mg oral tablet daily.  The patient has been tolerating his medications well. The patient denies any headache, visual changes, dizziness, lightheadness, chest pain, shortness of breath, orthopnea, weakness/numbness, and edema. He reports there have been no other symptoms noted.   He does have a history of Stage II CKD which is due to his history of T1 diabetes and his HTN.  He has had some decreased kidney function since 2014 but his creatinine over the last 2-3 years has ranged 1.2-1.5 with a GFR of 55-68.  He cannot take an ACE-I due to a history of diarrhea. He is currently on benicar  and farxiga  for his history of Stage II CKD.  The patient is a 58 year old Caucasian/White male who returns for a regularly scheduled thyroid  check.  On his last visit, his Free T4 was elevated and I asked [name] to continue his levothyroxine  down from 100mcg daily to 75mcg daily.  He was initially diagnosed with hyperthyroidism but he underwent iodine ablation and is now hypothyroidism.   He claims to have no symptoms suggestive of thyroid  imbalance specifically denying fatigue, cold intolerance, heat intolerance, tremors, anxiety,  unexplained weight changes.   [name], II returns today for routine followup on his cholesterol.  This past year, I tried  him on zetia  but this caused myalgias.  He remains on repatha.  He has been on Lipitor, crestor, pravastatin, lovastatin and zocor in the past and could not tolerate this.  He quit his welchol due to myalgias.  We tried him on livalo 2mg  daily which he tolerated well.  However his insurance copay was $1000 dollars and he could not afford this medication.  I started him on repatha 140mg  twice  monthly which he is tolerating well.  Overall, he states he is doing well and is without any complaints or problems at this time. He specifically denies chest pain, shortness of breath, abdominal pain, nausea, vomiting, diarrhea, myalgias, and fatigue. He remains on Repatha 140mg  sub twice a month.  He has been on repatha since 03/2018.  He is fasting in anticipation for labs today.        Past Medical History Past Medical History:  Diagnosis Date   CKD (chronic kidney disease), stage II    Diabetic nephropathy associated with type 1 diabetes mellitus (HCC)    Diabetic retinopathy associated with type 1 diabetes mellitus (HCC)    Essential hypertension    Hyperlipidemia    Hyperthyroidism    PAD (peripheral artery disease) (HCC)    Type 1 diabetes (HCC)      Allergies Allergies  Allergen Reactions   Atorvastatin Other (See Comments)    MYALGIAS  Myalgia.   Colesevelam Other (See Comments)    Myalgia.   Ramipril Diarrhea     Medications  Current Outpatient Medications:    aspirin EC 81 MG tablet, Take 81 mg by mouth once., Disp: , Rfl:    clopidogrel  (PLAVIX ) 75 MG tablet, TAKE 1 TABLET BY MOUTH EVERY DAY, Disp: 90 tablet, Rfl: 1   dapagliflozin  propanediol (FARXIGA ) 10 MG TABS tablet, Take 1 tablet (10 mg total) by mouth daily., Disp: 90 tablet, Rfl: 3   Evolocumab (REPATHA SURECLICK) 140 MG/ML SOAJ, INJECT 140MG  INTO THE SKIN TWICE A MONTH, Disp: 6 mL, Rfl: 3   fluticasone  (FLONASE ) 50 MCG/ACT nasal spray, PLACE 1 SPRAY INTO BOTH NOSTRILS IN THE MORNING AND AT BEDTIME., Disp: 16 mL, Rfl: 0   HUMALOG  100 UNIT/ML cartridge, , Disp: , Rfl:    latanoprost (XALATAN) 0.005 % ophthalmic solution, Place 1 drop into both eyes at bedtime., Disp: , Rfl:    levothyroxine  (SYNTHROID ) 75 MCG tablet, Take 1 tablet (75 mcg total) by mouth daily before breakfast., Disp: 30 tablet, Rfl: 2   olmesartan  (BENICAR ) 20 MG tablet, Take 1 tablet (20 mg total) by mouth daily., Disp: 90 tablet, Rfl:  1   HUMALOG  100 UNIT/ML injection, USES INSULIN  PUMP WITH BASAL RATE 12AM 0.35U/HR UNTIL 6AM THEN 0.4U UNTIL 12AM., Disp: 60 mL, Rfl: 3   insulin  lispro (HUMALOG ) 100 UNIT/ML injection, .35 ml at 6 a.m, then .40 ml until 12 a.m, Disp: 10 mL, Rfl: 3   Review of Systems Review of Systems  Constitutional:  Negative for chills, fever and malaise/fatigue.  Eyes:  Negative for blurred vision and double vision.  Respiratory:  Negative for cough and shortness of breath.   Cardiovascular:  Negative for chest pain, palpitations and leg swelling.  Gastrointestinal:  Positive for constipation. Negative for abdominal pain, diarrhea, nausea and vomiting.  Genitourinary:  Negative for frequency.  Musculoskeletal:  Negative for myalgias.  Skin:  Negative for itching and rash.  Neurological:  Negative for dizziness, weakness and headaches.  Endo/Heme/Allergies:  Negative for polydipsia.       Objective:    Vitals BP 120/62   Pulse 88   Temp 98 F (36.7 C) (Temporal)   Resp 18   Ht 5' 4 (1.626 m)   Wt 113 lb 9.6 oz (51.5 kg)   SpO2 96%   BMI 19.50 kg/m    Physical Examination Physical Exam Constitutional:      Appearance: Normal appearance. He is not ill-appearing.   Cardiovascular:     Rate and Rhythm: Normal rate and regular rhythm.     Pulses: Normal pulses.     Heart sounds: No murmur heard.    No friction rub. No gallop.  Pulmonary:     Effort: Pulmonary effort is normal. No respiratory distress.     Breath sounds: No wheezing, rhonchi or rales.  Abdominal:     General: Abdomen is flat. Bowel sounds are normal. There is no distension.     Palpations: Abdomen is soft.     Tenderness: There is no abdominal tenderness.   Musculoskeletal:     Right lower leg: No edema.     Left lower leg: No edema.   Skin:    General: Skin is warm and dry.     Findings: No rash.   Neurological:     General: No focal deficit present.     Mental Status: He is alert and oriented to person,  place, and time.   Psychiatric:        Mood and Affect: Mood normal.        Behavior: Behavior normal.        Assessment & Plan:   Essential hypertension His BP is currently well controlled.  We will continue to monitor.  Diabetic nephropathy associated with type 1 diabetes mellitus (HCC) He has stage II CKD and this past year he had some microalbuminuria where we started him on farxiga .  We will cotninue to control his diabetes at this time.  Postablative hypothyroidism We adjusted his thyroid  meds last visit and we will check his TFT's today.  He seems euthyroid.  Statin myopathy Plan as below.  Stage 2 chronic kidney disease We will continue with BP and diabetes control.  He is to avoid NSAIDs and we will continue on benicar  and farxiga .  We will repeat his urine studies on his next visit.  Mixed hyperlipidemia We will check his FLP on his next visit which will be his yearly exam.  He is to eat healthy, cut fats in diet and continue on repatha.  He has a history of PAD.  Statin intolerance Plan as above.    Return in about 3 months (around 09/25/2023) for annual.   Wayne Haines, MD

## 2023-06-20 NOTE — Assessment & Plan Note (Signed)
 We adjusted his thyroid  meds last visit and we will check his TFT's today.  He seems euthyroid.

## 2023-06-20 NOTE — Assessment & Plan Note (Signed)
 He has stage II CKD and this past year he had some microalbuminuria where we started him on farxiga .  We will cotninue to control his diabetes at this time.

## 2023-06-20 NOTE — Assessment & Plan Note (Signed)
 His BP is currently well controlled.  We will continue to monitor.

## 2023-06-20 NOTE — Assessment & Plan Note (Signed)
 We will check his FLP on his next visit which will be his yearly exam.  He is to eat healthy, cut fats in diet and continue on repatha.  He has a history of PAD.

## 2023-06-20 NOTE — Assessment & Plan Note (Signed)
 We will continue with BP and diabetes control.  He is to avoid NSAIDs and we will continue on benicar  and farxiga .  We will repeat his urine studies on his next visit.

## 2023-06-20 NOTE — Assessment & Plan Note (Signed)
 Plan as below.

## 2023-06-21 ENCOUNTER — Other Ambulatory Visit: Payer: Self-pay | Admitting: Internal Medicine

## 2023-06-21 LAB — TSH: TSH: 9.88 u[IU]/mL — ABNORMAL HIGH (ref 0.450–4.500)

## 2023-06-21 LAB — T4, FREE: Free T4: 1.18 ng/dL (ref 0.82–1.77)

## 2023-06-23 DIAGNOSIS — E1065 Type 1 diabetes mellitus with hyperglycemia: Secondary | ICD-10-CM | POA: Diagnosis not present

## 2023-06-26 ENCOUNTER — Ambulatory Visit: Payer: Self-pay

## 2023-06-26 DIAGNOSIS — E1042 Type 1 diabetes mellitus with diabetic polyneuropathy: Secondary | ICD-10-CM | POA: Diagnosis not present

## 2023-06-26 NOTE — Progress Notes (Signed)
 Patient called.  Patient aware.  I have called and informed the patient  Tell him to continue synthyroid 75mcg daily except on Sundays take 150mcg. Aaron Aas  Pt aware, states he will double the dose on Sundays.

## 2023-07-18 ENCOUNTER — Telehealth: Admitting: Internal Medicine

## 2023-07-18 DIAGNOSIS — U071 COVID-19: Secondary | ICD-10-CM | POA: Diagnosis not present

## 2023-07-18 MED ORDER — DOXYCYCLINE MONOHYDRATE 100 MG PO CAPS: 100.0000 mg | ORAL_CAPSULE | Freq: Two times a day (BID) | ORAL | 0 refills | Status: AC

## 2023-07-18 MED ORDER — PAXLOVID (300/100) 20 X 150 MG & 10 X 100MG PO TBPK: 3.0000 | ORAL_TABLET | Freq: Two times a day (BID) | ORAL | 0 refills | Status: AC

## 2023-07-18 NOTE — Assessment & Plan Note (Signed)
 He has COVID-19.  I did a medication interaction with paxlovid .  I have asked him to start this medication immediately.  He is to hold his plavix  while taking paxolovid.  Continue supportive care and go to the ER if he worsens.  I will also call in doxycycline .

## 2023-07-18 NOTE — Progress Notes (Signed)
 Office Visit  Subjective   Patient ID: Samuel [name]   DOB: October 07, 1965   Age: 58 y.o.   MRN: [id number]   Chief Complaint No chief complaint on file.    History of Present Illness Mr. Cegielski is a 58 yo male who calls in today for a telehealth visit for COVID 19.  The patient states he began having symptoms about 5 days ago with neuropathy in both legs started hurting, as well as cough productive of brownish sputum with fevers but no chills, chest congestion, SOB, wheezing or n/v/d.  He is taking mucinex.  He tested positive with a home COVID-19 testing kit yesterday.     Past Medical History Past Medical History:  Diagnosis Date   CKD (chronic kidney disease), stage II    Diabetic nephropathy associated with type 1 diabetes mellitus (HCC)    Diabetic retinopathy associated with type 1 diabetes mellitus (HCC)    Essential hypertension    Hyperlipidemia    Hyperthyroidism    PAD (peripheral artery disease) (HCC)    Type 1 diabetes (HCC)      Allergies Allergies  Allergen Reactions   Atorvastatin Other (See Comments)    MYALGIAS  Myalgia.   Colesevelam Other (See Comments)    Myalgia.   Ramipril Diarrhea     Medications  Current Outpatient Medications:    aspirin EC 81 MG tablet, Take 81 mg by mouth once., Disp: , Rfl:    clopidogrel  (PLAVIX ) 75 MG tablet, TAKE 1 TABLET BY MOUTH EVERY DAY, Disp: 90 tablet, Rfl: 1   dapagliflozin  propanediol (FARXIGA ) 10 MG TABS tablet, Take 1 tablet (10 mg total) by mouth daily., Disp: 90 tablet, Rfl: 3   Evolocumab (REPATHA SURECLICK) 140 MG/ML SOAJ, INJECT 140MG  INTO THE SKIN TWICE A MONTH, Disp: 6 mL, Rfl: 3   fluticasone  (FLONASE ) 50 MCG/ACT nasal spray, PLACE 1 SPRAY INTO BOTH NOSTRILS IN THE MORNING AND AT BEDTIME., Disp: 16 mL, Rfl: 0   HUMALOG  100 UNIT/ML cartridge, , Disp: , Rfl:    HUMALOG  100 UNIT/ML injection, USES INSULIN  PUMP WITH BASAL RATE 12AM 0.35U/HR UNTIL 6AM THEN 0.4U UNTIL 12AM., Disp: 60 mL, Rfl: 3    insulin  lispro (HUMALOG ) 100 UNIT/ML injection, .35 ml at 6 a.m, then .40 ml until 12 a.m, Disp: 10 mL, Rfl: 3   latanoprost (XALATAN) 0.005 % ophthalmic solution, Place 1 drop into both eyes at bedtime., Disp: , Rfl:    levothyroxine  (SYNTHROID ) 75 MCG tablet, TAKE 1 TABLET BY MOUTH DAILY BEFORE BREAKFAST., Disp: 30 tablet, Rfl: 2   olmesartan  (BENICAR ) 20 MG tablet, Take 1 tablet (20 mg total) by mouth daily., Disp: 90 tablet, Rfl: 1   Review of Systems Review of Systems  Constitutional:  Positive for fever. Negative for chills.  HENT:  Positive for congestion and sore throat.   Respiratory:  Positive for cough and sputum production. Negative for shortness of breath and wheezing.   Cardiovascular:  Negative for chest pain.  Gastrointestinal:  Negative for abdominal pain, constipation, diarrhea, nausea and vomiting.  Musculoskeletal:  Negative for myalgias.  Neurological:  Negative for dizziness, weakness and headaches.       Objective:    Vitals There were no vitals taken for this visit.   Physical Examination Physical Exam Constitutional:      Appearance: Normal appearance.  Neurological:     Mental Status: He is alert.  Psychiatric:        Mood and Affect: Mood normal.  Behavior: Behavior normal.        Assessment & Plan:   COVID-19 He has COVID-19.  I did a medication interaction with paxlovid .  I have asked him to start this medication immediately.  He is to hold his plavix  while taking paxolovid.  Continue supportive care and go to the ER if he worsens.  I will also call in doxycycline .    No follow-ups on file.   Selinda Fleeta Finger, MD

## 2023-08-21 DIAGNOSIS — E109 Type 1 diabetes mellitus without complications: Secondary | ICD-10-CM | POA: Diagnosis not present

## 2023-09-15 DIAGNOSIS — E782 Mixed hyperlipidemia: Secondary | ICD-10-CM | POA: Diagnosis not present

## 2023-09-15 DIAGNOSIS — E1042 Type 1 diabetes mellitus with diabetic polyneuropathy: Secondary | ICD-10-CM | POA: Diagnosis not present

## 2023-09-15 DIAGNOSIS — E890 Postprocedural hypothyroidism: Secondary | ICD-10-CM | POA: Diagnosis not present

## 2023-09-19 ENCOUNTER — Encounter: Payer: Self-pay | Admitting: Internal Medicine

## 2023-09-19 ENCOUNTER — Ambulatory Visit: Admitting: Internal Medicine

## 2023-09-19 VITALS — BP 140/82 | HR 87 | Temp 98.0°F | Resp 18 | Ht 64.0 in | Wt 113.0 lb

## 2023-09-19 DIAGNOSIS — Z681 Body mass index (BMI) 19 or less, adult: Secondary | ICD-10-CM

## 2023-09-19 DIAGNOSIS — J302 Other seasonal allergic rhinitis: Secondary | ICD-10-CM

## 2023-09-19 DIAGNOSIS — Z789 Other specified health status: Secondary | ICD-10-CM

## 2023-09-19 DIAGNOSIS — E10319 Type 1 diabetes mellitus with unspecified diabetic retinopathy without macular edema: Secondary | ICD-10-CM | POA: Diagnosis not present

## 2023-09-19 DIAGNOSIS — I10 Essential (primary) hypertension: Secondary | ICD-10-CM

## 2023-09-19 DIAGNOSIS — E1021 Type 1 diabetes mellitus with diabetic nephropathy: Secondary | ICD-10-CM | POA: Diagnosis not present

## 2023-09-19 DIAGNOSIS — Z89411 Acquired absence of right great toe: Secondary | ICD-10-CM

## 2023-09-19 DIAGNOSIS — N182 Chronic kidney disease, stage 2 (mild): Secondary | ICD-10-CM

## 2023-09-19 DIAGNOSIS — E1042 Type 1 diabetes mellitus with diabetic polyneuropathy: Secondary | ICD-10-CM | POA: Diagnosis not present

## 2023-09-19 DIAGNOSIS — G720 Drug-induced myopathy: Secondary | ICD-10-CM

## 2023-09-19 DIAGNOSIS — Z0000 Encounter for general adult medical examination without abnormal findings: Secondary | ICD-10-CM

## 2023-09-19 DIAGNOSIS — I739 Peripheral vascular disease, unspecified: Secondary | ICD-10-CM | POA: Diagnosis not present

## 2023-09-19 DIAGNOSIS — T466X5A Adverse effect of antihyperlipidemic and antiarteriosclerotic drugs, initial encounter: Secondary | ICD-10-CM

## 2023-09-19 DIAGNOSIS — E782 Mixed hyperlipidemia: Secondary | ICD-10-CM

## 2023-09-19 DIAGNOSIS — R339 Retention of urine, unspecified: Secondary | ICD-10-CM | POA: Insufficient documentation

## 2023-09-19 DIAGNOSIS — E890 Postprocedural hypothyroidism: Secondary | ICD-10-CM | POA: Diagnosis not present

## 2023-09-19 MED ORDER — TAMSULOSIN HCL 0.4 MG PO CAPS: 0.4000 mg | ORAL_CAPSULE | Freq: Every day | ORAL | 2 refills | Status: DC

## 2023-09-19 NOTE — Assessment & Plan Note (Signed)
 He can continue his OTC allergry medicines as needed.

## 2023-09-19 NOTE — Assessment & Plan Note (Signed)
 His BP is borderline but was controlled over this past year.  We will see what his BP is doing on his next visit.

## 2023-09-19 NOTE — Assessment & Plan Note (Signed)
 We will check his FLP today.  He is on repatha at this time.

## 2023-09-19 NOTE — Assessment & Plan Note (Signed)
 He has Stage II CKD due to his diabetes and HTN.  He is on an ARB and farxiga .  We will check his urine MACR today.  He is to avoid NSAIDS.  We will continue to control his diabetes and HTN.

## 2023-09-19 NOTE — Assessment & Plan Note (Signed)
 He is on farxiga  off label for his urine.  I will check his urine protein today.  He will continue with his insulin  pump at this time.  His last HgBA1c 4 days ago was 8.3%.  He is followed by endocrinology.

## 2023-09-19 NOTE — Assessment & Plan Note (Signed)
 He has statin intolerance with myalgias.

## 2023-09-19 NOTE — Assessment & Plan Note (Signed)
 We will continue with control of his diabetes, HTN and HLD.  He remains on plavix  and he decided to increase his asa to 325mg  daily.  He will see vascular surgery later this year.

## 2023-09-19 NOTE — Assessment & Plan Note (Signed)
He seems euthyroid.  We will check his TFT's today. 

## 2023-09-19 NOTE — Progress Notes (Signed)
 Office Visit  Subjective   Patient ID: Samuel Samuel [name]   DOB: [birth year]   Age: 58 y.o.   MRN: [id number]   Chief Complaint Chief Complaint  Patient presents with   Annual Exam    cpe     History of Present Illness Samuel Samuel [name] is a 58 year old Caucasian/White male who presents for his annual health maintenance exam. He is due for the following health maintenance studies: screening labs. This patient's past medical history Diabetes Mellitus, Type I, diabetic retinopathy, Hyperlipidemia, Hypertension, Hyperthyroidism, and Proteinuria.    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 They are following him diabetic retinopathy where they patient has had surgery in the past.  He has never had a colonoscopy and there is no family history of colon cancer. He has refused colonoscopy in the past and we did a cologuard 03/2016 and this was negative. He had another cologuard in 06/2020 and this was negative as well.  He has been contacted by cologuard to have this testing done this year.  The patient in the past complained of urinary hestiancy and retention first in the morning but recently he has had urinary retention and has to sit down to urinate.  The patient used to smoke but quit at age 52.  He smoked 1 ppd from age of 31 until he quit at 63.  The patient does exercise by walking and hunting. He does not get yearly flu vaccines. He has not had a pneumonia vaccine but he did take 2 COVID-19 vaccines but not any boosters. The patient denies any depression or anxiety. He is on Plavix  75mg  daily and ECASA 325mg  daily.   Samuel Samuel [name] is a 58 yo male who returns for a follow-up visit for his Type 1 diabetes.  Over the interim, he did see Dr. Tobie on 09/15/2023 and they did a A1c which was 8.3%  which was unchanged from previous testing.  He also saw the diabetic educator at that time.  The patient has some hypoglycemia episodes with blood sugars dropping to 50s to 60s range usually in the nighttime maybe twice a week and is due to him not eating. They provided the patient with appropriate dietary education based on the CGM findings.  He remains with an insulin  pump using Humalog  U-100.   He is also on farxiga  10mg  daily for his history of diabetic nephropathy  His last HgBA1c was done on 09/15/2023 and was 8.3%.   The patient is currently using a DEXCOM G7 CMG for blood sugar monitoring.   He specifically denies unexplained abdominal pain, nausea or vomiting or diarrhea.  He came in fasting today in anticipation of lab work.  He has a history of intolerance to ACE-I due to diarrhea. He has chronic complications of diabetic retinopathy, diabetic neuropathy and diabetic nephropathy with Stage Samuel [name] CKD.  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  We referred him to urology but he never went due to a death in the family.  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    The patient is a 58 year old Caucasian/White male who presents for a follow-up evaluation of hypertension. The patient has not been checking his blood pressure at home. The patient's current medications include: Benicar  20 mg oral tablet daily.  The patient has been tolerating his medications well. The patient denies any headache, visual changes, dizziness, lightheadness, chest pain, shortness of breath, orthopnea, weakness/numbness, and edema. He reports there have been no other symptoms noted.    He does have a history of Stage Samuel [name] CKD which is  due to his history of T1 diabetes and his HTN.  He has had some decreased kidney function since 2014 but his creatinine over the last 2-3 years has ranged 1.2-1.5 with a GFR of 55-68.  He cannot take an ACE-I due to a history of diarrhea. He is currently on benicar  and farxiga  for his history of Stage Samuel [name] CKD.   The patient is a 58 year old Caucasian/White male who returns for a regularly scheduled thyroid  check.  On his last visit, his Free T4 was elevated and I asked Samuel Samuel [name] to continue his levothyroxine  down from 100mcg daily to 75mcg daily.  He was initially diagnosed with hyperthyroidism but he underwent iodine ablation and is now hypothyroidism.   He claims to have no symptoms suggestive of thyroid  imbalance specifically denying fatigue, cold intolerance, heat intolerance, tremors, anxiety, unexplained weight changes.   Samuel Samuel [name], Samuel [name] returns today for routine followup on his cholesterol.  This past year, I tried  him on zetia  but this caused myalgias.  He remains on repatha.  He has been on Lipitor, crestor, pravastatin, lovastatin and zocor in the past and could not tolerate this.  He quit his welchol due to myalgias.  We tried him on livalo 2mg  daily which he tolerated well.  However his insurance copay was $1000 dollars and he could not afford this medication.  I started him on repatha 140mg  twice monthly which he is tolerating well.  Overall, he states he is doing well and is without any complaints or problems at this time. He specifically denies chest pain, shortness of breath, abdominal pain, nausea, vomiting, diarrhea, myalgias, and fatigue. He remains on Repatha 140mg  sub twice a month.  He has been on repatha since 03/2018.  He is fasting in anticipation for labs today.  The patient also has a history of peripheral arterial disease.  The patient has had a diabetic foot ulcer in the past of his right big toe in 2021.  We referred him to wound care where they performed a workup and  discovered on MRI that he had osteomyelitis of his right big toe.  The patient completed a 6 week course of antibiotics.  They did a vascular workup where he had an abnormal ABI of 0.64 on the right.  Her underwent pelvic and lower extremity arteriogram and it was discovered that he has PAD where he underwent revascularization of an occluded segment of his left superficial femoral artery on 08/30/2019.  He had combine atherectomy and balloon angioplasty of the right superficial femoral artery with single vessel runoff to the foot in the form of a hypertrophic peroneal artery.  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  He went just last week for combined atherectomy and balloon angioplasty where he had successful revascularization of his anterior tibial artery.  His wound slowly healed and he completed a course of hyperbaric oxygen  treatments.  They performed an ECHO on 10/2015 and this was normal with an EF of 55%.  He is followed by vascular yearly with his last visit around 12/2021 where he states they have been doing arterial US 's of his legs.  They want to continue medical management and discussed a dedicated walking program program to build collateral flow.  He remains on Plavix  daily and ECASA 81mg  daily.  He goes back this 12/2023 for followup with vascular surgery.    Samuel Samuel [name] also has a history of seasonal allergies.  The patient states early spring and winter time is the worse and he will have some wheezing.  He does complain of severe sneezing and watery itchy eyes and maybe has one ear infection per year.  I gave him an allergy shot in the past but he did not need this for this past year.  He has been using OTC allergy pills as needed.       Past Medical History Past Medical History:  Diagnosis Date   CKD (chronic kidney disease), stage Samuel [name]    Diabetic nephropathy associated with type 1 diabetes mellitus (HCC)    Diabetic retinopathy associated with type  1 diabetes mellitus (HCC)    Essential hypertension    Hyperlipidemia    Hyperthyroidism    PAD (peripheral artery disease) (HCC)    Type 1 diabetes (HCC)      Allergies Allergies  Allergen Reactions   Atorvastatin Other (See Comments)    MYALGIAS  Myalgia.   Colesevelam Other (See Comments)    Myalgia.   Ramipril Diarrhea     Medications  Current Outpatient Medications:    aspirin EC 81 MG tablet, Take 81 mg by mouth once., Disp: , Rfl:    clopidogrel  (PLAVIX ) 75 MG tablet, TAKE 1 TABLET BY MOUTH EVERY DAY, Disp: 90 tablet, Rfl: 1   dapagliflozin  propanediol (FARXIGA ) 10 MG TABS tablet, Take 1 tablet (10 mg total) by mouth daily., Disp: 90 tablet, Rfl: 3   doxycycline  (MONODOX ) 100 MG capsule, Take 1 capsule (100 mg total) by mouth 2 (two) times daily., Disp: 20 capsule, Rfl: 0   Evolocumab (REPATHA SURECLICK) 140 MG/ML SOAJ, INJECT 140MG  INTO THE SKIN TWICE A MONTH, Disp: 6 mL, Rfl: 3   fluticasone  (FLONASE ) 50 MCG/ACT nasal spray, PLACE 1 SPRAY INTO BOTH NOSTRILS IN THE MORNING AND AT BEDTIME., Disp: 16 mL, Rfl: 0   HUMALOG  100 UNIT/ML cartridge, , Disp: , Rfl:    HUMALOG  100 UNIT/ML injection, USES INSULIN  PUMP WITH BASAL RATE 12AM 0.35U/HR UNTIL 6AM THEN 0.4U UNTIL 12AM., Disp: 60 mL, Rfl: 3   insulin  lispro (HUMALOG ) 100 UNIT/ML injection, .35 ml at 6 a.m, then .40 ml until 12 a.m, Disp: 10 mL, Rfl: 3   latanoprost (XALATAN) 0.005 % ophthalmic solution, Place 1 drop into both eyes at bedtime., Disp: , Rfl:    levothyroxine  (SYNTHROID ) 75 MCG tablet, TAKE 1 TABLET BY MOUTH DAILY BEFORE BREAKFAST., Disp: 30 tablet, Rfl: 2   olmesartan  (BENICAR ) 20 MG tablet, Take 1 tablet (20 mg total) by mouth daily., Disp: 90 tablet, Rfl: 1   Review of Systems Review of Systems  Constitutional:  Negative for chills, fever, malaise/fatigue and weight loss.  Eyes:  Negative for blurred vision and double vision.  Respiratory:  Negative for cough, hemoptysis, shortness of breath and  wheezing.   Cardiovascular:  Negative for chest pain, palpitations and leg swelling.  Gastrointestinal:  Negative for abdominal pain, constipation, diarrhea, heartburn, nausea and vomiting.  Genitourinary:  Negative for frequency and hematuria.  Musculoskeletal:  Negative for myalgias.  Skin:  Negative for itching and rash.  Neurological:  Negative for dizziness, weakness and headaches.  Endo/Heme/Allergies:  Negative for polydipsia.       Objective:    Vitals BP (!) 140/82   Pulse 87   Temp 98 F (36.7 C) (Oral)   Resp 18   Ht 5' 4 (1.626 m)   Wt 113 lb (51.3 kg)   SpO2 98%   BMI 19.40 kg/m    Physical Examination Physical Exam Constitutional:      Appearance: Normal appearance. He is not ill-appearing.  HENT:     Head: Normocephalic and atraumatic.     Right Ear: Tympanic membrane, ear canal and external ear normal.     Left Ear: Tympanic membrane, ear canal and external ear normal.     Nose: Nose normal. No congestion or rhinorrhea.     Mouth/Throat:     Mouth: Mucous membranes are moist.     Pharynx: Oropharynx is clear. No oropharyngeal exudate or posterior oropharyngeal erythema.  Eyes:     General: No scleral icterus.    Conjunctiva/sclera: Conjunctivae normal.     Pupils: Pupils are equal, round, and reactive to light.  Neck:     Vascular: No carotid bruit.  Cardiovascular:     Rate and Rhythm: Normal rate and regular rhythm.     Pulses: Normal pulses.     Heart sounds: No murmur heard.    No friction rub. No gallop.  Pulmonary:     Effort: Pulmonary effort is normal. No respiratory distress.     Breath sounds: No wheezing, rhonchi or rales.  Abdominal:     General: Abdomen is flat. Bowel sounds are normal. There is no distension.     Palpations: Abdomen is soft.     Tenderness: There is no abdominal tenderness.  Musculoskeletal:     Cervical back: Neck supple. No tenderness.     Right lower leg: No edema.     Left lower leg: No edema.   Lymphadenopathy:     Cervical: No cervical adenopathy.  Skin:    General: Skin is warm and dry.     Findings: No rash.  Neurological:     General: No focal deficit present.     Mental Status: He is alert and oriented to person, place, and time.  Psychiatric:        Mood and Affect: Mood normal.        Behavior: Behavior normal.        Assessment & Plan:   PAD (peripheral artery disease) (HCC) We will continue with control of his diabetes, HTN and HLD.  He remains on plavix  and he decided to increase his asa to 325mg  daily.  He will see vascular surgery later this year.  Essential hypertension His BP is borderline but was controlled over this past year.  We will see what his BP is doing on his next visit.  Postablative hypothyroidism He seems euthyroid.  We will check his TFT's today.  Diabetic retinopathy associated with type 1 diabetes mellitus (HCC) He will contiue to followup yearly with the eye doctor.  Diabetic polyneuropathy associated with type 1 diabetes mellitus (HCC) He has no sensation of his feet and he has a history of his right big toe amputation.  We had a discussion regarding foot care.  Diabetic nephropathy associated with type 1 diabetes mellitus (HCC) He is on farxiga  off label for his urine.  I will check his urine protein today.  He will continue with his insulin  pump at this time.  His last HgBA1c 4 days ago was 8.3%.  He is followed by endocrinology.  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  Statin intolerance He has statin intolerance with myalgias.  Stage 2 chronic kidney disease He has Stage Samuel [name] CKD due to his diabetes and HTN.  He is on an ARB and farxiga .  We will check his urine MACR today.  He is to avoid NSAIDS.  We will continue to control his diabetes and HTN.  Status post amputation of right great toe (HCC) Plan as above.  Seasonal allergies He can continue his OTC allergry medicines as needed.  Mixed  hyperlipidemia We will check his FLP today.  He is on repatha at this time.  Annual physical exam Health maintenacne discussed.  I have asked him to contact cologuard to have this repeated.  We will obtain some yearly labs.  Urinary retention I am going to try him on flomax .  We will refer him to urology.    Return in about 3 months (around 12/19/2023).   Selinda Fleeta Finger, MD

## 2023-09-19 NOTE — Assessment & Plan Note (Signed)
 Plan as above.

## 2023-09-19 NOTE — Assessment & Plan Note (Addendum)
 Health maintenacne discussed.  I have asked him to contact cologuard to have this repeated.  We will obtain some yearly labs.

## 2023-09-19 NOTE — Assessment & Plan Note (Signed)
 I am going to try him on flomax .  We will refer him to urology.

## 2023-09-19 NOTE — Assessment & Plan Note (Signed)
 He will contiue to followup yearly with the eye doctor.

## 2023-09-19 NOTE — Assessment & Plan Note (Signed)
 He has no sensation of his feet and he has a history of his right big toe amputation.  We had a discussion regarding foot care.

## 2023-09-20 LAB — LIPID PANEL
Chol/HDL Ratio: 2.8 ratio (ref 0.0–5.0)
Cholesterol, Total: 179 mg/dL (ref 100–199)
HDL: 65 mg/dL (ref >39)
LDL Chol Calc (NIH): 103 mg/dL — ABNORMAL HIGH (ref 0–99)
Triglycerides: 55 mg/dL (ref 0–149)
VLDL Cholesterol Cal: 11 mg/dL (ref 5–40)

## 2023-09-20 LAB — CMP14 + ANION GAP
ALT: 20 IU/L (ref 0–44)
AST: 17 IU/L (ref 0–40)
Albumin: 4.7 g/dL (ref 3.8–4.9)
Alkaline Phosphatase: 80 IU/L (ref 44–121)
Anion Gap: 17 mmol/L (ref 10.0–18.0)
BUN/Creatinine Ratio: 15 (ref 9–20)
BUN: 18 mg/dL (ref 6–24)
Bilirubin Total: 0.3 mg/dL (ref 0.0–1.2)
CO2: 25 mmol/L (ref 20–29)
Calcium: 9.3 mg/dL (ref 8.7–10.2)
Chloride: 101 mmol/L (ref 96–106)
Creatinine, Ser: 1.18 mg/dL (ref 0.76–1.27)
Globulin, Total: 2.5 g/dL (ref 1.5–4.5)
Glucose: 93 mg/dL (ref 70–99)
Potassium: 4.3 mmol/L (ref 3.5–5.2)
Sodium: 143 mmol/L (ref 134–144)
Total Protein: 7.2 g/dL (ref 6.0–8.5)
eGFR: 72 mL/min/1.73 (ref >59)

## 2023-09-20 LAB — CBC WITH DIFFERENTIAL/PLATELET
Basophils Absolute: 0 x10E3/uL (ref 0.0–0.2)
Basos: 1 %
EOS (ABSOLUTE): 0.2 x10E3/uL (ref 0.0–0.4)
Eos: 4 %
Hematocrit: 42 % (ref 37.5–51.0)
Hemoglobin: 13.6 g/dL (ref 13.0–17.7)
Immature Grans (Abs): 0 x10E3/uL (ref 0.0–0.1)
Immature Granulocytes: 0 %
Lymphocytes Absolute: 1.6 x10E3/uL (ref 0.7–3.1)
Lymphs: 31 %
MCH: 31.3 pg (ref 26.6–33.0)
MCHC: 32.4 g/dL (ref 31.5–35.7)
MCV: 97 fL (ref 79–97)
Monocytes Absolute: 0.4 x10E3/uL (ref 0.1–0.9)
Monocytes: 7 %
Neutrophils Absolute: 2.9 x10E3/uL (ref 1.4–7.0)
Neutrophils: 57 %
Platelets: 355 x10E3/uL (ref 150–450)
RBC: 4.35 x10E6/uL (ref 4.14–5.80)
RDW: 13.6 % (ref 11.6–15.4)
WBC: 5.1 x10E3/uL (ref 3.4–10.8)

## 2023-09-20 LAB — PSA: Prostate Specific Ag, Serum: 1.5 ng/mL (ref 0.0–4.0)

## 2023-09-20 LAB — MICROALBUMIN / CREATININE URINE RATIO
Creatinine, Urine: 28.5 mg/dL
Microalb/Creat Ratio: 79 mg/g{creat} — ABNORMAL HIGH (ref 0–29)
Microalbumin, Urine: 22.6 ug/mL

## 2023-09-20 LAB — T4, FREE: Free T4: 1.23 ng/dL (ref 0.82–1.77)

## 2023-09-20 LAB — TSH: TSH: 21 u[IU]/mL — ABNORMAL HIGH (ref 0.450–4.500)

## 2023-09-25 ENCOUNTER — Other Ambulatory Visit: Payer: Self-pay | Admitting: Internal Medicine

## 2023-10-10 DIAGNOSIS — H04123 Dry eye syndrome of bilateral lacrimal glands: Secondary | ICD-10-CM | POA: Diagnosis not present

## 2023-10-10 DIAGNOSIS — H35372 Puckering of macula, left eye: Secondary | ICD-10-CM | POA: Diagnosis not present

## 2023-10-10 DIAGNOSIS — H401132 Primary open-angle glaucoma, bilateral, moderate stage: Secondary | ICD-10-CM | POA: Diagnosis not present

## 2023-10-23 ENCOUNTER — Ambulatory Visit: Payer: Self-pay

## 2023-10-23 NOTE — Progress Notes (Signed)
 Patient called.  Patient aware.

## 2023-10-24 ENCOUNTER — Other Ambulatory Visit: Payer: Self-pay

## 2023-10-24 MED ORDER — LEVOTHYROXINE SODIUM 100 MCG PO TABS: 100.0000 ug | ORAL_TABLET | Freq: Every day | ORAL | 11 refills | Status: AC

## 2023-11-15 ENCOUNTER — Other Ambulatory Visit: Payer: Self-pay | Admitting: Internal Medicine

## 2023-11-15 DIAGNOSIS — I10 Essential (primary) hypertension: Secondary | ICD-10-CM

## 2023-12-03 ENCOUNTER — Encounter: Payer: Self-pay | Admitting: *Deleted

## 2023-12-03 NOTE — Progress Notes (Signed)
 Samuel [name]                                          MRN: [id number]   12/03/2023   The [facility] Quality Team Specialist reviewed this patient medical record for the purposes of chart review for care gap closure. The following were reviewed: chart review for care gap closure-colorectal cancer screening.    [facility] Quality Team

## 2023-12-03 NOTE — Progress Notes (Signed)
 DENO SIDA                                          MRN: [id number]   12/03/2023   The [facility] Quality Team Specialist reviewed this patient medical record for the purposes of chart review for care gap closure. The following were reviewed: abstraction for care gap closure-glycemic status assessment.    [facility] Quality Team

## 2023-12-10 ENCOUNTER — Ambulatory Visit: Admitting: Internal Medicine

## 2023-12-10 VITALS — BP 110/60 | HR 91 | Temp 98.1°F | Resp 16

## 2023-12-10 DIAGNOSIS — N182 Chronic kidney disease, stage 2 (mild): Secondary | ICD-10-CM

## 2023-12-10 DIAGNOSIS — L03012 Cellulitis of left finger: Secondary | ICD-10-CM | POA: Insufficient documentation

## 2023-12-10 DIAGNOSIS — E1021 Type 1 diabetes mellitus with diabetic nephropathy: Secondary | ICD-10-CM

## 2023-12-10 DIAGNOSIS — E890 Postprocedural hypothyroidism: Secondary | ICD-10-CM

## 2023-12-10 MED ORDER — CEPHALEXIN 500 MG PO CAPS: 500.0000 mg | ORAL_CAPSULE | Freq: Three times a day (TID) | ORAL | 0 refills | Status: AC

## 2023-12-10 NOTE — Progress Notes (Signed)
 Office Visit  Subjective   Patient ID: Samuel [name]   DOB: Aug 23, 1965   Age: 58 y.o.   MRN: [id number]   Chief Complaint No chief complaint on file.    History of Present Illness Mr. Samuel [name] is a 58 yo male who returns for a follow-up visit for his Type 1 diabetes.  Over the interim, he did see Dr. Tobie on 09/15/2023 and they did a A1c which was 8.3% which was unchanged from previous testing.  He also saw the diabetic educator at that time.  The patient has some hypoglycemia episodes with blood sugars dropping to 50s to 60s range usually in the nighttime maybe twice a week and is due to him not eating. They provided the patient with appropriate dietary education based on the CGM findings.  He remains with an insulin  pump using Humalog  U-100.   He is also on farxiga  10mg  daily for his history of diabetic nephropathy  His last HgBA1c was done 3 months ago and was 8.3%.  The patient is currently using a DEXCOM G7 CMG for blood sugar monitoring.   He specifically denies unexplained abdominal pain, nausea or vomiting or diarrhea.  He came in fasting today in anticipation of lab work.  He has a history of intolerance to ACE-I due to diarrhea. He has chronic complications of diabetic retinopathy, diabetic neuropathy and diabetic nephropathy with Stage II CKD.  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  We referred him to urology but he never went due to a death in the family.  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   He does have a history of Stage II CKD which is due to his history of T1 diabetes and his HTN.  He has had some decreased kidney function since 2014 but his creatinine over the last  2-3 years has ranged 1.2-1.5 with a GFR of 55-68.  He cannot take an ACE-I due to a history of diarrhea. He is currently on benicar  and farxiga  for his history of Stage II CKD.   The patient is a 58 year old Caucasian/White male who returns for a regularly scheduled thyroid  check.  On his last visit, his TSH was elevated and I asked him to to take levothyroxine  75mcg po Monday thru Friday, and 100mcg po on Sat and Sun.  He was initially diagnosed with hyperthyroidism but he underwent iodine ablation and is now hypothyroidism.   He claims to have no symptoms suggestive of thyroid  imbalance specifically denying fatigue, cold intolerance, heat intolerance, tremors, anxiety, unexplained weight changes.  He also cut his the dorsum of his left thumb at his DIP joint last Thursday which was 6 days ago.  He did not seek medical care and stated that he probably did need stitches.  He refused to go to the ER and placed a butterfly strip across this.  The wound is healing but he is noticing some slight erythema developed but no drainage.     Past Medical History Past Medical History:  Diagnosis Date   CKD (chronic kidney disease), stage II    Diabetic nephropathy associated with type 1 diabetes mellitus (HCC)    Diabetic retinopathy associated with type  1 diabetes mellitus (HCC)    Essential hypertension    Hyperlipidemia    Hyperthyroidism    PAD (peripheral artery disease)    Type 1 diabetes (HCC)      Allergies Allergies  Allergen Reactions   Atorvastatin Other (See Comments)    MYALGIAS  Myalgia.   Colesevelam Other (See Comments)    Myalgia.   Ramipril Diarrhea     Medications  Current Outpatient Medications:    aspirin EC 81 MG tablet, Take 81 mg by mouth once., Disp: , Rfl:    clopidogrel  (PLAVIX ) 75 MG tablet, TAKE 1 TABLET BY MOUTH EVERY DAY, Disp: 30 tablet, Rfl: 0   dapagliflozin  propanediol (FARXIGA ) 10 MG TABS tablet, Take 1 tablet (10 mg total) by mouth daily., Disp: 90  tablet, Rfl: 3   doxycycline  (MONODOX ) 100 MG capsule, Take 1 capsule (100 mg total) by mouth 2 (two) times daily., Disp: 20 capsule, Rfl: 0   Evolocumab (REPATHA SURECLICK) 140 MG/ML SOAJ, INJECT 140MG  INTO THE SKIN TWICE A MONTH, Disp: 6 mL, Rfl: 3   fluticasone  (FLONASE ) 50 MCG/ACT nasal spray, PLACE 1 SPRAY INTO BOTH NOSTRILS IN THE MORNING AND AT BEDTIME., Disp: 16 mL, Rfl: 0   HUMALOG  100 UNIT/ML cartridge, , Disp: , Rfl:    HUMALOG  100 UNIT/ML injection, USES INSULIN  PUMP WITH BASAL RATE 12AM 0.35U/HR UNTIL 6AM THEN 0.4U UNTIL 12AM., Disp: 60 mL, Rfl: 3   insulin  lispro (HUMALOG ) 100 UNIT/ML injection, .35 ml at 6 a.m, then .40 ml until 12 a.m, Disp: 10 mL, Rfl: 3   latanoprost (XALATAN) 0.005 % ophthalmic solution, Place 1 drop into both eyes at bedtime., Disp: , Rfl:    levothyroxine  (SYNTHROID ) 100 MCG tablet, Take 1 tablet (100 mcg total) by mouth daily., Disp: 30 tablet, Rfl: 11   levothyroxine  (SYNTHROID ) 75 MCG tablet, TAKE 1 TABLET BY MOUTH DAILY BEFORE BREAKFAST., Disp: 30 tablet, Rfl: 2   olmesartan  (BENICAR ) 20 MG tablet, Take 1 tablet (20 mg total) by mouth daily., Disp: 90 tablet, Rfl: 1   tamsulosin  (FLOMAX ) 0.4 MG CAPS capsule, Take 1 capsule (0.4 mg total) by mouth daily., Disp: 30 capsule, Rfl: 2   Review of Systems Review of Systems  Constitutional:  Negative for chills and fever.  Eyes:  Positive for blurred vision. Negative for double vision.  Respiratory:  Negative for cough and shortness of breath.   Cardiovascular:  Negative for chest pain, palpitations and leg swelling.  Gastrointestinal:  Negative for abdominal pain, constipation, diarrhea, nausea and vomiting.  Genitourinary:  Negative for frequency.  Musculoskeletal:  Negative for myalgias.  Skin:  Negative for itching and rash.  Neurological:  Negative for dizziness, weakness and headaches.  Endo/Heme/Allergies:  Negative for polydipsia.       Objective:    Vitals BP 110/60   Pulse 91   Temp 98.1 F  (36.7 C)   Resp 16   SpO2 96%    Physical Examination Physical Exam Constitutional:      Appearance: Normal appearance. He is not ill-appearing.  Cardiovascular:     Rate and Rhythm: Normal rate and regular rhythm.     Pulses: Normal pulses.     Heart sounds: No murmur heard.    No friction rub. No gallop.  Pulmonary:     Effort: Pulmonary effort is normal. No respiratory distress.     Breath sounds: No wheezing, rhonchi or rales.  Abdominal:     General: Abdomen is flat. Bowel sounds are normal. There is no  distension.     Palpations: Abdomen is soft.     Tenderness: There is no abdominal tenderness.  Musculoskeletal:     Right lower leg: No edema.     Left lower leg: No edema.  Skin:    General: Skin is warm and dry.     Findings: No rash.     Comments: He has a 2cm laceration that is healing well over his dorsum of the DIP joint of his left thumb.  There is about 0.5cm area of erythema around this but no drainage.  His finger is neurovascularly intact.  Neurological:     Mental Status: He is alert.        Assessment & Plan:   Postablative hypothyroidism We will check his thyroid  function on his next visit.  Diabetic nephropathy associated with type 1 diabetes mellitus (HCC) We will recheck his HgBA1c at this time.    Stage 2 chronic kidney disease He has diabetic nephropathy.  He is to avoid NSAIDS.  Cellulitis of finger of left hand He has some mild cellulitis where we will start him on keflex .    Return in about 3 months (around 03/09/2024).   Selinda Fleeta Finger, MD

## 2023-12-10 NOTE — Addendum Note (Signed)
 Addended by: VAN EYK, Pooja Camuso on: 12/10/2023 04:52 PM   Modules accepted: Orders

## 2023-12-10 NOTE — Assessment & Plan Note (Signed)
 We will recheck his HgBA1c at this time.

## 2023-12-10 NOTE — Assessment & Plan Note (Signed)
 He has some mild cellulitis where we will start him on keflex .

## 2023-12-10 NOTE — Assessment & Plan Note (Signed)
 He has diabetic nephropathy.  He is to avoid NSAIDS.

## 2023-12-10 NOTE — Assessment & Plan Note (Signed)
 We will check his thyroid  function on his next visit.

## 2023-12-11 LAB — HEMOGLOBIN A1C
Est. average glucose Bld gHb Est-mCnc: 203 mg/dL
Hgb A1c MFr Bld: 8.7 % — ABNORMAL HIGH (ref 4.8–5.6)

## 2023-12-17 ENCOUNTER — Other Ambulatory Visit: Payer: Self-pay | Admitting: Internal Medicine

## 2023-12-17 ENCOUNTER — Ambulatory Visit: Admitting: Internal Medicine

## 2023-12-17 DIAGNOSIS — E890 Postprocedural hypothyroidism: Secondary | ICD-10-CM | POA: Diagnosis not present

## 2023-12-17 DIAGNOSIS — I10 Essential (primary) hypertension: Secondary | ICD-10-CM

## 2023-12-17 NOTE — Progress Notes (Signed)
 Nurse Visit Lab Draw TSH, Free T4

## 2023-12-17 NOTE — Addendum Note (Signed)
 Addended by: LENETTA LACKS on: 12/17/2023 02:47 PM   Modules accepted: Orders

## 2023-12-18 LAB — TSH: TSH: 7.3 u[IU]/mL — ABNORMAL HIGH (ref 0.450–4.500)

## 2023-12-18 LAB — T4, FREE: Free T4: 1.65 ng/dL (ref 0.82–1.77)

## 2023-12-19 ENCOUNTER — Ambulatory Visit: Admitting: Internal Medicine

## 2023-12-23 ENCOUNTER — Ambulatory Visit: Payer: Self-pay

## 2023-12-23 NOTE — Progress Notes (Signed)
 Patient called.  Patient aware.  Per Dr. Fleeta Finger His A1c was about the same it was 3 months ago. SABRA

## 2023-12-28 ENCOUNTER — Other Ambulatory Visit: Payer: Self-pay | Admitting: Internal Medicine

## 2024-01-07 NOTE — Progress Notes (Signed)
 GAL FELDHAUS                                          MRN: [id number]   01/07/2024   The [facility] Quality Team Specialist reviewed this patient medical record for the purposes of chart review for care gap closure. The following were reviewed: chart review for care gap closure-colorectal cancer screening.    [facility] Quality Team

## 2024-01-07 NOTE — Progress Notes (Signed)
 ANIL HAVARD                                          MRN: [id number]   01/07/2024   The [facility] Quality Team Specialist reviewed this patient medical record for the purposes of chart review for care gap closure. The following were reviewed: abstraction for care gap closure-glycemic status assessment.    [facility] Quality Team

## 2024-01-13 DIAGNOSIS — I10 Essential (primary) hypertension: Secondary | ICD-10-CM

## 2024-01-29 ENCOUNTER — Other Ambulatory Visit: Payer: Self-pay

## 2024-01-29 MED ORDER — OLMESARTAN MEDOXOMIL 20 MG PO TABS: 20.0000 mg | ORAL_TABLET | Freq: Every day | ORAL | 1 refills | Status: AC

## 2024-03-09 ENCOUNTER — Ambulatory Visit: Admitting: Internal Medicine
